# Patient Record
Sex: Female | Born: 1985 | Hispanic: No | Marital: Single | Smoking: Current every day smoker
Health system: Southern US, Community
[De-identification: ages and names within clinical notes are randomized; demographics above are authoritative.]

## PROBLEM LIST (undated history)

## (undated) DIAGNOSIS — D649 Anemia, unspecified: Secondary | ICD-10-CM

## (undated) DIAGNOSIS — F329 Major depressive disorder, single episode, unspecified: Secondary | ICD-10-CM

## (undated) DIAGNOSIS — J45909 Unspecified asthma, uncomplicated: Secondary | ICD-10-CM

## (undated) DIAGNOSIS — Z8719 Personal history of other diseases of the digestive system: Secondary | ICD-10-CM

## (undated) DIAGNOSIS — F419 Anxiety disorder, unspecified: Secondary | ICD-10-CM

## (undated) DIAGNOSIS — K8020 Calculus of gallbladder without cholecystitis without obstruction: Secondary | ICD-10-CM

## (undated) DIAGNOSIS — F32A Depression, unspecified: Secondary | ICD-10-CM

## (undated) HISTORY — DX: Depression, unspecified: F32.A

## (undated) HISTORY — DX: Major depressive disorder, single episode, unspecified: F32.9

## (undated) HISTORY — DX: Anxiety disorder, unspecified: F41.9

## (undated) HISTORY — DX: Unspecified asthma, uncomplicated: J45.909

---

## 2012-04-24 ENCOUNTER — Ambulatory Visit: Payer: Self-pay | Admitting: Family Medicine

## 2012-04-24 VITALS — BP 109/75 | HR 90 | Temp 98.5°F | Resp 18 | Ht 63.0 in | Wt 135.0 lb

## 2012-04-24 DIAGNOSIS — F411 Generalized anxiety disorder: Secondary | ICD-10-CM | POA: Insufficient documentation

## 2012-04-24 MED ORDER — CLONAZEPAM 0.5 MG PO TABS: ORAL_TABLET | ORAL | Status: DC

## 2012-04-24 NOTE — Patient Instructions (Addendum)
Please come and see [place] again in 1 or 2 weeks to check on your progress.  We need to have you see a counselor and will work on this the next time we see you.  If you are getting worse in the meantime please call or come back in.
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
## 2012-07-19 ENCOUNTER — Telehealth: Payer: Self-pay

## 2012-07-19 DIAGNOSIS — F411 Generalized anxiety disorder: Secondary | ICD-10-CM

## 2012-07-19 MED ORDER — CLONAZEPAM 0.5 MG PO TABS: ORAL_TABLET | ORAL | Status: DC
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
## 2012-07-19 NOTE — Telephone Encounter (Signed)
Patient is calling for a refill on Klonopin that was prescribed by Dr. Patsy Lager this year.  Call back at: [phone] for any questions. Patient has contacted pharmacy for a request already.   Pharmacy:  [id number] - [place], [place] - [address] AT [facility] [address]

## 2012-07-23 ENCOUNTER — Encounter (HOSPITAL_COMMUNITY): Payer: Self-pay | Admitting: Emergency Medicine

## 2012-07-23 ENCOUNTER — Emergency Department (HOSPITAL_COMMUNITY)
Admission: EM | Admit: 2012-07-23 | Discharge: 2012-07-24 | Disposition: A | Discharge status: Home / Self Care | Payer: Self-pay | Attending: Emergency Medicine | Admitting: Emergency Medicine

## 2012-07-23 DIAGNOSIS — W268XXA Contact with other sharp object(s), not elsewhere classified, initial encounter: Secondary | ICD-10-CM | POA: Insufficient documentation

## 2012-07-23 DIAGNOSIS — S61209A Unspecified open wound of unspecified finger without damage to nail, initial encounter: Secondary | ICD-10-CM | POA: Insufficient documentation

## 2012-07-23 DIAGNOSIS — Y9389 Activity, other specified: Secondary | ICD-10-CM | POA: Insufficient documentation

## 2012-07-23 DIAGNOSIS — Y92410 Unspecified street and highway as the place of occurrence of the external cause: Secondary | ICD-10-CM | POA: Insufficient documentation

## 2012-07-23 DIAGNOSIS — Z79899 Other long term (current) drug therapy: Secondary | ICD-10-CM | POA: Insufficient documentation

## 2012-07-23 DIAGNOSIS — F411 Generalized anxiety disorder: Secondary | ICD-10-CM | POA: Insufficient documentation

## 2012-07-23 DIAGNOSIS — S61219A Laceration without foreign body of unspecified finger without damage to nail, initial encounter: Secondary | ICD-10-CM

## 2012-07-23 DIAGNOSIS — F17200 Nicotine dependence, unspecified, uncomplicated: Secondary | ICD-10-CM | POA: Insufficient documentation

## 2012-07-23 NOTE — ED Notes (Signed)
PT. PRESENTS WITH RIGHT INDEX FINGER LACERATION APPROX. 1 1/2 INCH SUSTAINED WHILE MOVING  A GLASS TABLE THIS EVENING BLEEDING CONTROLLED , DRESSING APPLIED AT TRIAGE .

## 2012-07-24 ENCOUNTER — Emergency Department (HOSPITAL_COMMUNITY)
Admission: RE | Admit: 2012-07-24 | Discharge: 2012-07-24 | Disposition: A | Discharge status: Home / Self Care | Payer: Self-pay | Source: Ambulatory Visit | Attending: Emergency Medicine | Admitting: Emergency Medicine

## 2012-07-24 MED ORDER — TETANUS-DIPHTH-ACELL PERTUSSIS 5-2.5-18.5 LF-MCG/0.5 IM SUSP
0.5000 mL | Freq: Once | INTRAMUSCULAR | Status: DC
  Filled 2012-07-24: qty 0.5

## 2012-07-24 MED ORDER — OXYCODONE-ACETAMINOPHEN 5-325 MG PO TABS
2.0000 | ORAL_TABLET | Freq: Once | ORAL | Status: DC
  Filled 2012-07-24: qty 2

## 2012-07-24 NOTE — ED Provider Notes (Signed)
History     CSN: [id number]  Arrival date & time 07/23/12  2105   First MD Initiated Contact with Patient 07/23/12 2328      Chief Complaint  Patient presents with  . Finger Injury    (Consider location/radiation/quality/duration/timing/severity/associated sxs/prior treatment) HPI Comments: Patient is a 27 year old female with no significant past medical history of presents for a laceration to her right index finger. Patient states she was moving a glass table at home when it fell, broke in half, and sliced her finger. Patient minutes to a throbbing pain sensation runny aggravating or alleviating factors that is nonradiating. Patient denies pallor, numbness, and coolness of her right index finger. Date of last tetanus is unknown.  The history is provided by the patient. No language interpreter was used.    Past Medical History  Diagnosis Date  . Anxiety     History reviewed. No pertinent past surgical history.  No family history on file.  History  Substance Use Topics  . Smoking status: Light Tobacco Smoker  . Smokeless tobacco: Not on file  . Alcohol Use: No    OB History   Grav Para Term Preterm Abortions TAB SAB Ect Mult Living                  Review of Systems  Constitutional: Negative for fever.  Musculoskeletal: Negative for joint swelling.  Skin: Positive for wound. Negative for pallor.  Neurological: Negative for weakness and numbness.  All other systems reviewed and are negative.    Allergies  Review of patient's allergies indicates no known allergies.  Home Medications   Current Outpatient Rx  Name  Route  Sig  Dispense  Refill  . Acetaminophen-Caff-Pyrilamine (MIDOL COMPLETE PO)   Oral   Take 1 tablet by mouth 2 (two) times daily as needed (menstral cramps).         . citalopram (CELEXA) 20 MG tablet   Oral   Take 20 mg by mouth daily.         . clonazePAM (KLONOPIN) 0.5 MG tablet   Oral   Take 0.25-0.5 mg by mouth every 8 (eight)  hours as needed for anxiety.           BP 127/68  Pulse 90  Temp(Src) 98.1 F (36.7 C) (Oral)  Resp 16  SpO2 100%  LMP 07/14/2012  Physical Exam  Nursing note and vitals reviewed. Constitutional: She is oriented to person, place, and time. She appears well-developed and well-nourished. No distress.  HENT:  Head: Normocephalic.  Eyes: Conjunctivae and EOM are normal. No scleral icterus.  Neck: Normal range of motion.  Cardiovascular: Normal rate, regular rhythm and intact distal pulses.   Distal radial pulse 2+. Capillary refill normal and right upper extremity  Pulmonary/Chest: Effort normal. No respiratory distress.  Musculoskeletal:       Right hand: She exhibits tenderness and laceration. She exhibits normal range of motion, no bony tenderness and normal capillary refill. Normal sensation noted. Normal strength noted.       Hands: 3.5-4 cm laceration to the palmar aspect of the right index finger distal to the PIP joint. Patient exhibits full range of motion of her right hand and fingers. No swelling, pallor, or paresthesias noted. Capillary refill normal.  Neurological: She is alert and oriented to person, place, and time.  Skin: Skin is warm and dry. She is not diaphoretic. No pallor.  Psychiatric: She has a normal mood and affect. Her behavior is normal.  ED Course  Procedures (including critical care time)  Labs Reviewed - No data to display No results found.  LACERATION REPAIR Performed by: Antony Madura Authorized by: Antony Madura Consent: Verbal consent obtained. Risks and benefits: risks, benefits and alternatives were discussed Consent given by: patient Patient identity confirmed: provided demographic data Prepped and Draped in normal sterile fashion Wound explored  Laceration Location: R index finger on palmar surface  Laceration Length: 4cm  No Foreign Bodies seen or palpated  Anesthesia: local infiltration  Local anesthetic: lidocaine 2%  without epinephrine  Anesthetic total: 0.5 ml  Irrigation method: syringe Amount of cleaning: standard  Skin closure: 5-0 black monofilament nonabsorbable  Number of sutures: 5  Technique: simple interrupted  Patient tolerance: Patient tolerated the procedure well with no immediate complications.   1. Laceration of finger of right hand, initial encounter     MDM  Patient presents for a laceration to her right index finger. Patient is neurovascularly intact without erythema, swelling, warmth, or drainage to suspect infection. X-ray ordered to rule out fracture. Tetanus ordered to be given in ED.  Have called radiology reading room who state radiologist impression without any radiopaque foreign body, fracture, or dislocation. Patient's laceration closed with 5 simple sutures; patient tolerated well. Appropriate for d/c with follow up in 10-14 days for suture removal. Referral to hand specialist provided and indications for ED return discussed. Patient states comfort and understanding with this d/c plan with no unaddressed concerns.      Antony Madura, [place] 07/24/12 [phone]

## 2012-07-24 NOTE — ED Notes (Signed)
See downtime charting. 

## 2012-07-24 NOTE — ED Provider Notes (Signed)
Medical screening examination/treatment/procedure(s) were performed by non-physician practitioner and as supervising physician I was immediately available for consultation/collaboration.   Dreden Rivere L Raahi Korber, MD 07/24/12 0718 

## 2012-11-12 ENCOUNTER — Telehealth: Payer: Self-pay

## 2012-11-12 MED ORDER — CITALOPRAM HYDROBROMIDE 20 MG PO TABS: 20.0000 mg | ORAL_TABLET | Freq: Every day | ORAL | Status: DC

## 2012-11-12 NOTE — Telephone Encounter (Signed)
No refill request received.  Pt needs follow up.  Will send 1 month of celexa and will forward request for klonopin to Dr. Patsy Lager to advise.  Needs OV for further refills  Dr. Patsy Lager - looks like this pt is long overdue for follow up, but wanted you to be aware of the request

## 2012-11-12 NOTE — Telephone Encounter (Signed)
PT STATES SHE IS IN NEED OF HER CELEXA AND KLONOPIN, HAVE BEEN OUT FOR A WHILE NOW AND THE PHARMACY STATED THEY HAVE BEEN TRYING TO GET IN TOUCH WITH [place] PLEASE CALL [phone]    [facility] [address]

## 2012-11-13 MED ORDER — CLONAZEPAM 0.5 MG PO TABS: 0.2500 mg | ORAL_TABLET | Freq: Three times a day (TID) | ORAL | Status: DC | PRN

## 2012-11-13 NOTE — Telephone Encounter (Signed)
Advised patient she is due for follow up

## 2012-11-13 NOTE — Addendum Note (Signed)
Addended by: Abbe Amsterdam C on: 11/13/2012 01:34 PM   Modules accepted: Orders

## 2012-12-11 ENCOUNTER — Other Ambulatory Visit: Payer: Self-pay | Admitting: Physician Assistant

## 2013-01-17 ENCOUNTER — Other Ambulatory Visit: Payer: Self-pay | Admitting: Physician Assistant

## 2013-01-17 ENCOUNTER — Ambulatory Visit: Payer: Self-pay | Admitting: Family Medicine

## 2013-01-17 VITALS — BP 105/80 | HR 80 | Temp 97.8°F | Resp 18 | Ht 62.5 in | Wt 176.0 lb

## 2013-01-17 DIAGNOSIS — F411 Generalized anxiety disorder: Secondary | ICD-10-CM

## 2013-01-17 MED ORDER — CLONAZEPAM 0.5 MG PO TABS: 0.2500 mg | ORAL_TABLET | Freq: Three times a day (TID) | ORAL | Status: DC | PRN

## 2013-01-17 MED ORDER — CITALOPRAM HYDROBROMIDE 20 MG PO TABS: 20.0000 mg | ORAL_TABLET | Freq: Every day | ORAL | Status: DC

## 2013-01-17 NOTE — Telephone Encounter (Signed)
PT STATES THAT SHE HAS NOT HAD THIS MEDICATION IN 6 DAYS AND SHE IS BEGINNING TO EXPERIENCE WITHDRAWALS. BEST# [phone]

## 2013-01-17 NOTE — Patient Instructions (Signed)
Go back to taking your celexa as usually.  Please come and see me in 6 months or sooner if needed.   Use the klonopin only as needed for anxiety

## 2013-01-17 NOTE — Progress Notes (Signed)
[facility] [address], [place] [address] [phone]  Date:  01/17/2013   Name:  Norma Goodman   DOB:  [birth year]   MRN:  [id number]  PCP:  No PCP Per Patient    Chief Complaint: Medication Refill   History of Present Illness:  Norma Goodman is a 27 y.o. very pleasant female patient who presents with the following:  She is here today due to needing a refill on her celexa.  I last saw her in February for our first visit and refilled her celexa.  She did run out of her clelexa about 5 days ago.  She has noted some nausea and vomiting, having bad dreams since she ran out.  She has been taking a klonopin daily since she ran out of her celexa- prior to this she was taking a 1/2 tab every 2 or 3 weeks.    She does feel that when she is taking the celexa she is doing ok.  Her daugher will be turning 2 in January. She has special needs so Ashle stays home taking care of her.  She is living with her mother right now.  Overall she feels that her mood is ok, she does not have any SI.  Wonders if she might be able to stop the celexa at some point  At this time she does not want to see a Veterinary surgeon.    LMP about 3 weeks ago  Patient Active Problem List   Diagnosis Date Noted  . Anxiety state, unspecified 04/24/2012    Past Medical History  Diagnosis Date  . Anxiety     History reviewed. No pertinent past surgical history.  History  Substance Use Topics  . Smoking status: Light Tobacco Smoker  . Smokeless tobacco: Not on file  . Alcohol Use: No    Family History  Problem Relation Age of Onset  . Diabetes Father   . Hypertension Father   . Heart disease Father     No Known Allergies  Medication list has been reviewed and updated.  Current Outpatient Prescriptions on File Prior to Visit  Medication Sig Dispense Refill  . citalopram (CELEXA) 20 MG tablet Take 1 tablet (20 mg total) by mouth daily. Needs office visit for further refills-2nd  notice  30 tablet  0  . clonazePAM (KLONOPIN) 0.5 MG tablet Take 0.5-1 tablets (0.25-0.5 mg total) by mouth every 8 (eight) hours as needed for anxiety.  15 tablet  0   No current facility-administered medications on file prior to visit.    Review of Systems:  As per HPI- otherwise negative.   Physical Examination: Filed Vitals:   01/17/13 1146  BP: 90/50  Pulse: 80  Temp: 97.8 F (36.6 C)  Resp: 18   Filed Vitals:   01/17/13 1146  Height: 5' 2.5" (1.588 m)  Weight: 176 lb (79.833 kg)   Body mass index is 31.66 kg/(m^2). Ideal Body Weight: Weight in (lb) to have BMI = 25: 138.6  GEN: WDWN, NAD, Non-toxic, A & O x 3, overweight HEENT: Atraumatic, Normocephalic. Neck supple. No masses, No LAD. Ears and Nose: No external deformity. CV: RRR, No M/G/R. No JVD. No thrill. No extra heart sounds. PULM: CTA B, no wheezes, crackles, rhonchi. No retractions. No resp. distress. No accessory muscle use. EXTR: No c/c/e NEURO Normal gait.  PSYCH: Normally interactive. Conversant. Not depressed or anxious appearing.  Calm demeanor.    Assessment and Plan: Generalized anxiety disorder - Plan: citalopram (CELEXA)  20 MG tablet, clonazePAM (KLONOPIN) 0.5 MG tablet, DISCONTINUED: clonazePAM (KLONOPIN) 0.5 MG tablet  Refilled her celexa.  If her sx do not resolve once back on her medication she will let me know.  Encouraged her to try and continue using her klonopin only when 100% necessary.  Suggested that we wait until spring to try and d/c her celexa if she would like to avoid SAD symptoms.   Follow- up in 6 months   Signed Abbe Amsterdam, MD

## 2013-03-03 ENCOUNTER — Other Ambulatory Visit: Payer: Self-pay | Admitting: Family Medicine

## 2013-03-03 DIAGNOSIS — F411 Generalized anxiety disorder: Secondary | ICD-10-CM

## 2013-04-10 ENCOUNTER — Other Ambulatory Visit: Payer: Self-pay | Admitting: Family Medicine

## 2013-04-10 DIAGNOSIS — F411 Generalized anxiety disorder: Secondary | ICD-10-CM

## 2013-05-02 ENCOUNTER — Telehealth: Payer: Self-pay | Admitting: General Practice

## 2013-05-02 DIAGNOSIS — F411 Generalized anxiety disorder: Secondary | ICD-10-CM

## 2013-05-02 NOTE — Telephone Encounter (Signed)
Patient called requesting a refill on clonazePAM [facility]) Please advise

## 2013-05-04 MED ORDER — CLONAZEPAM 0.5 MG PO TABS: ORAL_TABLET | ORAL | Status: DC

## 2013-05-29 ENCOUNTER — Other Ambulatory Visit: Payer: Self-pay | Admitting: Family Medicine

## 2013-06-07 ENCOUNTER — Telehealth: Payer: Self-pay

## 2013-06-07 DIAGNOSIS — F411 Generalized anxiety disorder: Secondary | ICD-10-CM

## 2013-06-07 NOTE — Telephone Encounter (Signed)
PATIENT STATES SHE CALLED HER PHARMACY TO REQUEST A REFILL ON CLONAZEPAM 0.5 MG, BUT THEY TOLD HER TO CALL HER DOCTOR'S OFFICE. PLEASE CALL PATIENT WHEN IT HAS BEEN CALLED IN. BEST PHONE [phone] (CELL)  PHARMACY CHOICE IS [place]

## 2013-06-08 MED ORDER — CLONAZEPAM 0.5 MG PO TABS: ORAL_TABLET | ORAL | Status: DC

## 2013-06-13 ENCOUNTER — Other Ambulatory Visit: Payer: Self-pay | Admitting: Family Medicine

## 2013-06-13 DIAGNOSIS — F411 Generalized anxiety disorder: Secondary | ICD-10-CM

## 2013-06-13 NOTE — Telephone Encounter (Signed)
Patients spouse called to see if Celexa refill can be sped up since they are leaving out of town tonight. Please call when refill is called in. They had pharamacy already send in request.   Best: [phone]

## 2013-06-14 MED ORDER — CITALOPRAM HYDROBROMIDE 20 MG PO TABS: 20.0000 mg | ORAL_TABLET | Freq: Every day | ORAL | Status: DC

## 2013-07-01 ENCOUNTER — Other Ambulatory Visit: Payer: Self-pay | Admitting: Family Medicine

## 2013-07-01 DIAGNOSIS — F411 Generalized anxiety disorder: Secondary | ICD-10-CM

## 2013-07-01 NOTE — Telephone Encounter (Signed)
Called her; I have been asking her to come in for a recheck. Had to [facility]. I will send in 5 more klonopin but there will not be any more until she has an OV here.  Please come and see me asap

## 2013-07-14 ENCOUNTER — Other Ambulatory Visit: Payer: Self-pay | Admitting: Family Medicine

## 2013-07-15 NOTE — Telephone Encounter (Signed)
Called and discussed with her.  She knows she cannot have more without an OV.  Gave her some options to come and see me and she plans to do so

## 2013-08-22 ENCOUNTER — Telehealth: Payer: Self-pay

## 2013-08-22 NOTE — Telephone Encounter (Signed)
Called her back.  She now has medicaid which we do not accept.  We will work with her on cost, however I cannot refill this another time without a visit.  She understands and will come in tomorrow

## 2013-08-22 NOTE — Telephone Encounter (Signed)
Dr Lorelei Pont  Patient has an insurance we do not accept.   She cannot afford to come in to be seen. She needs her anxiety medication.   [phone]

## 2013-08-23 ENCOUNTER — Ambulatory Visit (INDEPENDENT_AMBULATORY_CARE_PROVIDER_SITE_OTHER): Payer: Self-pay | Admitting: Family Medicine

## 2013-08-23 VITALS — BP 130/92 | HR 96 | Temp 98.6°F | Resp 18 | Ht 62.5 in | Wt 196.8 lb

## 2013-08-23 DIAGNOSIS — F411 Generalized anxiety disorder: Secondary | ICD-10-CM

## 2013-08-23 DIAGNOSIS — R635 Abnormal weight gain: Secondary | ICD-10-CM

## 2013-08-23 LAB — POCT URINE PREGNANCY: PREG TEST UR: NEGATIVE

## 2013-08-23 MED ORDER — CLONAZEPAM 0.5 MG PO TABS: ORAL_TABLET | ORAL | Status: DC

## 2013-08-23 MED ORDER — CITALOPRAM HYDROBROMIDE 40 MG PO TABS: 40.0000 mg | ORAL_TABLET | Freq: Every day | ORAL | Status: DC

## 2013-08-23 NOTE — Progress Notes (Signed)
[facility] [address] [place] [address] [phone]  Date:  08/23/2013   Name:  Norma Goodman   DOB:  Dec 06, 1985   MRN:  [id number]  PCP:  No PCP Per Patient    Chief Complaint: Medication Refill and Weight Gain   History of Present Illness:  Norma Goodman is a 28 y.o. very pleasant female patient who presents with the following:  Here today for a medication refill.  Last seen by myself about 6 months ago- she has not wanted to follow-up for financial reasons and actually does now have medicaid. She has not yet found out who is her medicaid provider. However I told her that I cannot refill her medications another time unless she is seen, so she did come in today.   She notes that she still does have some anxiety, and has gained some weight recently.  Her sleep is good.  Her appetite is good.  She does note some depression, and "I get stressed out really easily."  She will feel nervous at little provocation, "I have too much drama."  She is also concerned about weight gain- Wt Readings from Last 3 Encounters:  08/23/13 196 lb 12.8 oz (89.268 kg)  01/17/13 176 lb (79.833 kg)  04/24/12 135 lb (61.236 kg)   She does note that she added meat back to her diet over the last year or so, and that she has "gotten her appetite back."  She notes some bloating and gas, and can have diarrhea.  She did have an episiotomy with her delivery, and notes that she can sometimes have some fecal accidents since delivery.  Her daughter is now 27.26 years old Admits to drinking at least 2 regular sodas a day and that she does not really exercise.   She is SA with her SO- they do not use any contraception.    Patient Active Problem List   Diagnosis Date Noted  . Anxiety state, unspecified 04/24/2012    Past Medical History  Diagnosis Date  . Anxiety   . Depression     History reviewed. No pertinent past surgical history.  History  Substance Use Topics  . Smoking  status: Light Tobacco Smoker  . Smokeless tobacco: Not on file  . Alcohol Use: No    Family History  Problem Relation Age of Onset  . Diabetes Father   . Hypertension Father   . Heart disease Father     No Known Allergies  Medication list has been reviewed and updated.  Current Outpatient Prescriptions on File Prior to Visit  Medication Sig Dispense Refill  . aspirin-acetaminophen-caffeine (EXCEDRIN MIGRAINE) 250-250-65 MG per tablet Take by mouth every 6 (six) hours as needed for headache.      . citalopram (CELEXA) 20 MG tablet Take 1 tablet (20 mg total) by mouth daily.  90 tablet  0  . clonazePAM (KLONOPIN) 0.5 MG tablet Take 1/2 or 1 tablet every 8 hours as needed for anxiety.  There will NOT BE ANY MORE REFILLS until office visit is complete.  5 tablet  0   No current facility-administered medications on file prior to visit.    Review of Systems:  As per HPI- otherwise negative.   Physical Examination: Filed Vitals:   08/23/13 1558  BP: 130/92  Pulse: 117  Temp: 98.6 F (37 C)  Resp: 18   Filed Vitals:   08/23/13 1558  Height: 5' 2.5" (1.588 m)  Weight: 196 lb 12.8 oz (89.268 kg)  Body mass index is 35.4 kg/(m^2). Ideal Body Weight: Weight in (lb) to have BMI = 25: 138.6  GEN: WDWN, NAD, Non-toxic, A & O x 3, obese, looks well HEENT: Atraumatic, Normocephalic. Neck supple. No masses, No LAD. Ears and Nose: No external deformity. CV: RRR, No M/G/R. No JVD. No thrill. No extra heart sounds. PULM: CTA B, no wheezes, crackles, rhonchi. No retractions. No resp. distress. No accessory muscle use. ABD: S, NT, ND, +BS. No rebound. No HSM. EXTR: No c/c/e NEURO Normal gait.  PSYCH: Normally interactive. Conversant. Not depressed or anxious appearing.  Calm demeanor.   Results for orders placed in visit on 08/23/13  POCT URINE PREGNANCY      Result Value Ref Range   Preg Test, Ur Negative      Assessment and Plan: Weight gain - Plan: POCT urine  pregnancy  Generalized anxiety disorder - Plan: citalopram (CELEXA) 40 MG tablet  GAD (generalized anxiety disorder) - Plan: clonazePAM (KLONOPIN) 0.5 MG tablet  Will increase her citalopram to 40 mg as she continues to notice sx of depression and anxiety.  She denies any SI.  She does like to have some klonopin on hand to use prn but understands that this medication is habit forming and that she needs to minimize her use.  Recommended a TSH level and possible GI referral, but she will plan to address these issues through her medicaid provider.   Discussed weight management strategies that she will work on See patient instructions for more details.   Encouraged condoms as they do not desire another pregnancy right now.  She may be able to get implanon or an IUD as well through her medicaid    Signed Lamar Blinks, MD

## 2013-08-23 NOTE — Patient Instructions (Signed)
We are going to try going up on your celexa to 40 mg- I hope that this will help with your symptoms of anxiety and depression.   Continue to use the klonopin as needed, but remember that this is habit forming.    Work on quitting sodas, and on cutting down on your portion size.  Walking 30- 45 minutes a day is also a good idea; I suspect that if you follow these steps you will be able to lose 1-2 lbs per week and will get to your desired weight gradually.

## 2013-09-05 ENCOUNTER — Other Ambulatory Visit: Payer: Self-pay | Admitting: Family Medicine

## 2013-09-05 DIAGNOSIS — F411 Generalized anxiety disorder: Secondary | ICD-10-CM

## 2013-09-06 NOTE — Telephone Encounter (Signed)
Called and LMOM.  It seems she is going through her klonopin faster than usual- is all ok?  Also please establish with her medicaid provider who will be taking care of her in the future

## 2013-09-26 ENCOUNTER — Other Ambulatory Visit: Payer: Self-pay | Admitting: Family Medicine

## 2013-09-26 DIAGNOSIS — F411 Generalized anxiety disorder: Secondary | ICD-10-CM

## 2013-09-28 NOTE — Telephone Encounter (Signed)
Refilled for her today.  She is supposed to be getting in with her medicaid provider.  If she asks again will remind her of this.

## 2013-10-10 ENCOUNTER — Other Ambulatory Visit: Payer: Self-pay | Admitting: Family Medicine

## 2013-10-10 DIAGNOSIS — F411 Generalized anxiety disorder: Secondary | ICD-10-CM

## 2013-10-10 NOTE — Telephone Encounter (Signed)
Called and LMOM. I received her klonopin request again.  As we have discussed, she needs to establish with her medicaid provide.  I will give her 10 more- that will be all from my office  Meds ordered this encounter  Medications  . clonazePAM (KLONOPIN) 0.5 MG tablet    Sig: Take 1/2 or 1 tablet every 8 hours as needed for anxiety.  This will be your last rx from my office.  Please contact your medicaid provider    Dispense:  10 tablet    Refill:  0

## 2014-03-13 HISTORY — PX: ESOPHAGOGASTRODUODENOSCOPY: SHX1529

## 2014-09-07 ENCOUNTER — Ambulatory Visit (INDEPENDENT_AMBULATORY_CARE_PROVIDER_SITE_OTHER): Payer: Self-pay | Admitting: Emergency Medicine

## 2014-09-07 VITALS — BP 108/88 | HR 113 | Temp 98.6°F | Resp 20 | Ht 63.0 in | Wt 194.0 lb

## 2014-09-07 DIAGNOSIS — K921 Melena: Secondary | ICD-10-CM

## 2014-09-07 LAB — POCT CBC
Granulocyte percent: 84.9 %G — AB (ref 37–80)
HCT, POC: 40.1 % (ref 37.7–47.9)
Hemoglobin: 13.1 g/dL (ref 12.2–16.2)
Lymph, poc: 2.4 (ref 0.6–3.4)
MCH, POC: 28.3 pg (ref 27–31.2)
MCHC: 32.6 g/dL (ref 31.8–35.4)
MCV: 86.9 fL (ref 80–97)
MID (cbc): 0.4 (ref 0–0.9)
MPV: 7.6 fL (ref 0–99.8)
POC Granulocyte: 15.6 — AB (ref 2–6.9)
POC LYMPH PERCENT: 13.1 %L (ref 10–50)
POC MID %: 2 %M (ref 0–12)
Platelet Count, POC: 478 10*3/uL — AB (ref 142–424)
RBC: 4.62 M/uL (ref 4.04–5.48)
RDW, POC: 14.8 %
WBC: 18.4 10*3/uL — AB (ref 4.6–10.2)

## 2014-09-07 NOTE — Patient Instructions (Signed)
Rectal Bleeding °Rectal bleeding is when blood passes out of the anus. It is usually a sign that something is wrong. It may not be serious, but it should always be evaluated. Rectal bleeding may present as bright red blood or extremely dark stools. The color may range from dark red or maroon to black (like tar). It is important that the cause of rectal bleeding be identified so treatment can be started and the problem corrected. °CAUSES  °· Hemorrhoids. These are enlarged (dilated) blood vessels or veins in the anal or rectal area. °· Fistulas. These are abnormal, burrowing channels that usually run from inside the rectum to the skin around the anus. They can bleed. °· Anal fissures. This is a tear in the tissue of the anus. Bleeding occurs with bowel movements. °· Diverticulosis. This is a condition in which pockets or sacs project from the bowel wall. Occasionally, the sacs can bleed. °· Diverticulitis. This is an infection involving diverticulosis of the colon. °· Proctitis and colitis. These are conditions in which the rectum, colon, or both, can become inflamed and pitted (ulcerated). °· Polyps and cancer. Polyps are non-cancerous (benign) growths in the colon that may bleed. Certain types of polyps turn into cancer. °· Protrusion of the rectum. Part of the rectum can project from the anus and bleed. °· Certain medicines. °· Intestinal infections. °· Blood vessel abnormalities. °HOME CARE INSTRUCTIONS °· Eat a high-fiber diet to keep your stool soft. °· Limit activity. °· Drink enough fluids to keep your urine clear or pale yellow. °· Warm baths may be useful to soothe rectal pain. °· Follow up with your caregiver as directed. °SEEK IMMEDIATE MEDICAL CARE IF: °· You develop increased bleeding. °· You have black or dark red stools. °· You vomit blood or material that looks like coffee grounds. °· You have abdominal pain or tenderness. °· You have a fever. °· You feel weak, nauseous, or you faint. °· You have  severe rectal pain or you are unable to have a bowel movement. °MAKE SURE YOU: °· Understand these instructions. °· Will watch your condition. °· Will get help right away if you are not doing well or get worse. °Document Released: 08/19/2001 Document Revised: 05/22/2011 Document Reviewed: 08/14/2010 °ExitCare® Patient Information ©2015 [employer]. This information is not intended to replace advice given to you by your health care provider. Make sure you discuss any questions you have with your health care provider. ° °

## 2014-09-07 NOTE — Progress Notes (Signed)
Subjective:  Patient ID: Norma Goodman, female    DOB: Jun 09, 1985  Age: 29 y.o. MRN: [id number]  CC: Rectal Bleeding   HPI Norma Goodman presents  with a history of one years duration rectal bleeding. She said she has painful stools. Noticed any change in stool caliber. No nausea or vomiting. She has no abdominal pain. No fever or chills. She has no history of gastrointestinal disorders. She had no improvement with over-the-counter medication.  History Norma Goodman has a past medical history of Anxiety and Depression.   She has no past surgical history on file.   Her  family history includes Diabetes in her father; Heart disease in her father; Hypertension in her father.  She   reports that she has been smoking.  She does not have any smokeless tobacco history on file. She reports that she does not drink alcohol or use illicit drugs.  Outpatient Prescriptions Prior to Visit  Medication Sig Dispense Refill  . aspirin-acetaminophen-caffeine (EXCEDRIN MIGRAINE) 250-250-65 MG per tablet Take by mouth every 6 (six) hours as needed for headache.    . citalopram (CELEXA) 40 MG tablet Take 1 tablet (40 mg total) by mouth daily. 90 tablet 1  . clonazePAM (KLONOPIN) 0.5 MG tablet Take 1/2 or 1 tablet every 8 hours as needed for anxiety.  This will be your last rx from my office.  Please contact your medicaid provider 10 tablet 0   No facility-administered medications prior to visit.    History   Social History  . Marital Status: Single    Spouse Name: N/A  . Number of Children: N/A  . Years of Education: N/A   Social History Main Topics  . Smoking status: Light Tobacco Smoker  . Smokeless tobacco: Not on file  . Alcohol Use: No  . Drug Use: No  . Sexual Activity: Not on file   Other Topics Concern  . None   Social History Narrative     Review of Systems  Constitutional: Negative for fever, chills and appetite change.  HENT: Negative for congestion, ear pain, postnasal  drip, sinus pressure and sore throat.   Eyes: Negative for pain and redness.  Respiratory: Negative for cough, shortness of breath and wheezing.   Cardiovascular: Negative for leg swelling.  Gastrointestinal: Positive for rectal pain. Negative for nausea, vomiting, abdominal pain, diarrhea, constipation and blood in stool.  Endocrine: Negative for polyuria.  Genitourinary: Negative for dysuria, urgency, frequency and flank pain.  Musculoskeletal: Negative for gait problem.  Skin: Negative for rash.  Neurological: Negative for weakness and headaches.  Psychiatric/Behavioral: Negative for confusion and decreased concentration. The patient is not nervous/anxious.     Objective:  BP 108/88 mmHg  Pulse 113  Temp(Src) 98.6 F (37 C)  Resp 20  Ht 5\' 3"  (1.6 m)  Wt 194 lb (87.998 kg)  BMI 34.37 kg/m2  SpO2 94%  LMP 08/17/2014  Physical Exam  Constitutional: She is oriented to person, place, and time. She appears well-developed and well-nourished. No distress.  HENT:  Head: Normocephalic and atraumatic.  Right Ear: External ear normal.  Left Ear: External ear normal.  Nose: Nose normal.  Eyes: Conjunctivae and EOM are normal. Pupils are equal, round, and reactive to light. No scleral icterus.  Neck: Normal range of motion. Neck supple. No tracheal deviation present.  Cardiovascular: Normal rate, regular rhythm and normal heart sounds.   Pulmonary/Chest: Effort normal. No respiratory distress. She has no wheezes. She has no rales.  Abdominal: She exhibits no mass.  There is no tenderness. There is no rebound and no guarding.  Musculoskeletal: She exhibits no edema.  Lymphadenopathy:    She has no cervical adenopathy.  Neurological: She is alert and oriented to person, place, and time. Coordination normal.  Skin: Skin is warm and dry. No rash noted.  Psychiatric: She has a normal mood and affect. Her behavior is normal.   Digital rectal examination was significant for a nodular  tenderness on the anterior anal wall. There is no visible gross blood  Assessment & Plan:   Norma Goodman was seen today for rectal bleeding.  Diagnoses and all orders for this visit:  Blood in stool Orders: -     POCT CBC -     Ambulatory referral to Gastroenterology   I am having Norma Goodman maintain her aspirin-acetaminophen-caffeine, citalopram, and clonazePAM.  No orders of the defined types were placed in this encounter.   Patient needs to follow-up with GI and she was sent on an urgent referral.  Appropriate red flag conditions were discussed with the patient as well as actions that should be taken.  Patient expressed his understanding.  Follow-up: No Follow-up on file.  Roselee Culver, MD   Results for orders placed or performed in visit on 09/07/14  POCT CBC  Result Value Ref Range   WBC 18.4 (A) 4.6 - 10.2 K/uL   Lymph, poc 2.4 0.6 - 3.4   POC LYMPH PERCENT 13.1 10 - 50 %L   MID (cbc) 0.4 0 - 0.9   POC MID % 2.0 0 - 12 %M   POC Granulocyte 15.6 (A) 2 - 6.9   Granulocyte percent 84.9 (A) 37 - 80 %G   RBC 4.62 4.04 - 5.48 M/uL   Hemoglobin 13.1 12.2 - 16.2 g/dL   HCT, POC 40.1 37.7 - 47.9 %   MCV 86.9 80 - 97 fL   MCH, POC 28.3 27 - 31.2 pg   MCHC 32.6 31.8 - 35.4 g/dL   RDW, POC 14.8 %   Platelet Count, POC 478 (A) 142 - 424 K/uL   MPV 7.6 0 - 99.8 fL

## 2014-09-09 ENCOUNTER — Encounter (HOSPITAL_COMMUNITY): Payer: Self-pay | Admitting: Emergency Medicine

## 2014-09-09 ENCOUNTER — Emergency Department (HOSPITAL_COMMUNITY): Payer: Medicaid Other

## 2014-09-09 ENCOUNTER — Emergency Department (HOSPITAL_COMMUNITY)
Admission: EM | Admit: 2014-09-09 | Discharge: 2014-09-09 | Disposition: A | Discharge status: Home / Self Care | Payer: Medicaid Other | Attending: Emergency Medicine | Admitting: Emergency Medicine

## 2014-09-09 DIAGNOSIS — Z720 Tobacco use: Secondary | ICD-10-CM | POA: Insufficient documentation

## 2014-09-09 DIAGNOSIS — F419 Anxiety disorder, unspecified: Secondary | ICD-10-CM | POA: Diagnosis not present

## 2014-09-09 DIAGNOSIS — R079 Chest pain, unspecified: Secondary | ICD-10-CM | POA: Diagnosis present

## 2014-09-09 DIAGNOSIS — G43909 Migraine, unspecified, not intractable, without status migrainosus: Secondary | ICD-10-CM | POA: Insufficient documentation

## 2014-09-09 DIAGNOSIS — R1013 Epigastric pain: Secondary | ICD-10-CM | POA: Insufficient documentation

## 2014-09-09 DIAGNOSIS — R0602 Shortness of breath: Secondary | ICD-10-CM | POA: Insufficient documentation

## 2014-09-09 DIAGNOSIS — F329 Major depressive disorder, single episode, unspecified: Secondary | ICD-10-CM | POA: Insufficient documentation

## 2014-09-09 DIAGNOSIS — Z79899 Other long term (current) drug therapy: Secondary | ICD-10-CM | POA: Insufficient documentation

## 2014-09-09 DIAGNOSIS — Z3202 Encounter for pregnancy test, result negative: Secondary | ICD-10-CM | POA: Diagnosis not present

## 2014-09-09 DIAGNOSIS — M546 Pain in thoracic spine: Secondary | ICD-10-CM | POA: Diagnosis not present

## 2014-09-09 DIAGNOSIS — R202 Paresthesia of skin: Secondary | ICD-10-CM | POA: Insufficient documentation

## 2014-09-09 LAB — BASIC METABOLIC PANEL
ANION GAP: 11 (ref 5–15)
BUN: 20 mg/dL (ref 6–20)
CALCIUM: 9.4 mg/dL (ref 8.9–10.3)
CO2: 23 mmol/L (ref 22–32)
CREATININE: 0.78 mg/dL (ref 0.44–1.00)
Chloride: 101 mmol/L (ref 101–111)
GFR calc Af Amer: >60 mL/min (ref >60)
GFR calc non Af Amer: >60 mL/min (ref >60)
Glucose, Bld: 80 mg/dL (ref 65–99)
Potassium: 3.9 mmol/L (ref 3.5–5.1)
Sodium: 135 mmol/L (ref 135–145)

## 2014-09-09 LAB — I-STAT TROPONIN, ED: Troponin i, poc: 0 ng/mL (ref 0.00–0.08)

## 2014-09-09 LAB — CBC
HEMATOCRIT: 40.5 % (ref 36.0–46.0)
HEMOGLOBIN: 13.6 g/dL (ref 12.0–15.0)
MCH: 29.6 pg (ref 26.0–34.0)
MCHC: 33.6 g/dL (ref 30.0–36.0)
MCV: 88.2 fL (ref 78.0–100.0)
PLATELETS: 497 10*3/uL — AB (ref 150–400)
RBC: 4.59 MIL/uL (ref 3.87–5.11)
RDW: 14.1 % (ref 11.5–15.5)
WBC: 17.8 10*3/uL — AB (ref 4.0–10.5)

## 2014-09-09 LAB — BRAIN NATRIURETIC PEPTIDE: B Natriuretic Peptide: 342.8 pg/mL — ABNORMAL HIGH (ref 0.0–100.0)

## 2014-09-09 LAB — POC URINE PREG, ED: Preg Test, Ur: NEGATIVE

## 2014-09-09 MED ORDER — OMEPRAZOLE 20 MG PO CPDR: 20.0000 mg | DELAYED_RELEASE_CAPSULE | Freq: Every day | ORAL | Status: DC

## 2014-09-09 MED ORDER — GI COCKTAIL ~~LOC~~
30.0000 mL | Freq: Once | ORAL | Status: AC
  Administered 2014-09-09: 30 mL via ORAL
  Filled 2014-09-09: qty 30

## 2014-09-09 MED ORDER — PANTOPRAZOLE SODIUM 40 MG PO TBEC: 40.0000 mg | DELAYED_RELEASE_TABLET | Freq: Every day | ORAL | Status: DC

## 2014-09-09 MED ORDER — HYDROCODONE-ACETAMINOPHEN 5-325 MG PO TABS
1.0000 | ORAL_TABLET | Freq: Once | ORAL | Status: AC
  Administered 2014-09-09: 1 via ORAL
  Filled 2014-09-09: qty 1

## 2014-09-09 NOTE — Discharge Instructions (Signed)
You were seen today for chest pain and back pain. You've recently also noted some blood in your stools. Your history and physical exam is most consistent with gastritis versus peptic ulcer disease. You will be given a acid reducer. He should follow-up with the GI doctor as directed by her primary physician.  Peptic Ulcer A peptic ulcer is a sore in the lining of your esophagus (esophageal ulcer), stomach (gastric ulcer), or in the first part of your small intestine (duodenal ulcer). The ulcer causes erosion into the deeper tissue. CAUSES  Normally, the lining of the stomach and the small intestine protects itself from the acid that digests food. The protective lining can be damaged by:  An infection caused by a bacterium called Helicobacter pylori (H. pylori).  Regular use of nonsteroidal anti-inflammatory drugs (NSAIDs), such as ibuprofen or aspirin.  Smoking tobacco. Other risk factors include being older than 68, drinking alcohol excessively, and having a family history of ulcer disease.  SYMPTOMS   Burning pain or gnawing in the area between the chest and the belly button.  Heartburn.  Nausea and vomiting.  Bloating. The pain can be worse on an empty stomach and at night. If the ulcer results in bleeding, it can cause:  Black, tarry stools.  Vomiting of bright red blood.  Vomiting of coffee-ground-looking materials. DIAGNOSIS  A diagnosis is usually made based upon your history and an exam. Other tests and procedures may be performed to find the cause of the ulcer. Finding a cause will help determine the best treatment. Tests and procedures may include:  Blood tests, stool tests, or breath tests to check for the bacterium H. pylori.  An upper gastrointestinal (GI) series of the esophagus, stomach, and small intestine.  An endoscopy to examine the esophagus, stomach, and small intestine.  A biopsy. TREATMENT  Treatment may include:  Eliminating the cause of the ulcer,  such as smoking, NSAIDs, or alcohol.  Medicines to reduce the amount of acid in your digestive tract.  Antibiotic medicines if the ulcer is caused by the H. pylori bacterium.  An upper endoscopy to treat a bleeding ulcer.  Surgery if the bleeding is severe or if the ulcer created a hole somewhere in the digestive system. HOME CARE INSTRUCTIONS   Avoid tobacco, alcohol, and caffeine. Smoking can increase the acid in the stomach, and continued smoking will impair the healing of ulcers.  Avoid foods and drinks that seem to cause discomfort or aggravate your ulcer.  Only take medicines as directed by your caregiver. Do not substitute over-the-counter medicines for prescription medicines without talking to your caregiver.  Keep any follow-up appointments and tests as directed. SEEK MEDICAL CARE IF:   Your do not improve within 7 days of starting treatment.  You have ongoing indigestion or heartburn. SEEK IMMEDIATE MEDICAL CARE IF:   You have sudden, sharp, or persistent abdominal pain.  You have bloody or dark black, tarry stools.  You vomit blood or vomit that looks like coffee grounds.  You become light-headed, weak, or feel faint.  You become sweaty or clammy. MAKE SURE YOU:   Understand these instructions.  Will watch your condition.  Will get help right away if you are not doing well or get worse. Document Released: 02/25/2000 Document Revised: 07/14/2013 Document Reviewed: 09/27/2011 [facility] Patient Information 2015 [place], [place]. This information is not intended to replace advice given to you by your health care provider. Make sure you discuss any questions you have with your health care provider.

## 2014-09-09 NOTE — ED Provider Notes (Signed)
CSN: [id number]     Arrival date & time 09/09/14  0055 History  This chart was scribed for Merryl Hacker, MD by Rayfield Citizen, ED Scribe. This patient was seen in room D30C/D30C and the patient's care was started at 2:39 AM.    Chief Complaint  Patient presents with  . Chest Pain   Patient is a 29 y.o. female presenting with chest pain. The history is provided by the patient. No language interpreter was used.  Chest Pain Associated symptoms: no abdominal pain, no back pain, no cough, no fever, no nausea, no shortness of breath and not vomiting      HPI Comments: Norma Goodman is a 29 y.o. female who presents to the Emergency Department complaining of sudden onset central chest pain, described as "pressure", with upper back pain and paresthesias to the left arm beginning this morning. Norma Goodman also reports mild SOB and vomiting today (no blood). No modifying factors noted at this time. Norma Goodman denies fever, cough. Norma Goodman denies prior experience with similar symptoms. Norma Goodman denies history of blood clots, lower extremity swelling. Norma Goodman denies exogenous estrogen use, recent travel or hospitalizations.   Patient's family reports that patient has been in and out of doctor's offices over the past several days for bright red "blood in her stool." Patient visited her PCP today for a referral to a specialist regarding this matter.   Patient takes Celexa, Klonopin daily; Norma Goodman takes Excedrin migraine regularly. Norma Goodman is a smoker.   Past Medical History  Diagnosis Date  . Anxiety   . Depression    History reviewed. No pertinent past surgical history. Family History  Problem Relation Age of Onset  . Diabetes Father   . Hypertension Father   . Heart disease Father    History  Substance Use Topics  . Smoking status: Light Tobacco Smoker  . Smokeless tobacco: Not on file  . Alcohol Use: No   OB History    No data available     Review of Systems  Constitutional: Negative for fever.  Respiratory: Negative  for cough, chest tightness and shortness of breath.   Cardiovascular: Positive for chest pain.  Gastrointestinal: Positive for blood in stool. Negative for nausea, vomiting and abdominal pain.  Genitourinary: Negative for dysuria.  Musculoskeletal: Negative for back pain.  All other systems reviewed and are negative.   Allergies  Review of patient's allergies indicates no known allergies.  Home Medications   Prior to Admission medications   Medication Sig Start Date End Date Taking? Authorizing Provider  aspirin-acetaminophen-caffeine (EXCEDRIN MIGRAINE) [phone] MG per tablet Take 2 tablets by mouth every 6 (six) hours as needed for headache.    Yes Historical Provider, MD  citalopram (CELEXA) 40 MG tablet Take 1 tablet (40 mg total) by mouth daily. 08/23/13  Yes Gay Filler Copland, MD  clonazePAM (KLONOPIN) 0.5 MG tablet Take 1/2 or 1 tablet every 8 hours as needed for anxiety.  This will be your last rx from my office.  Please contact your medicaid provider 10/10/13  Yes Darreld Mclean, MD  omeprazole (PRILOSEC) 20 MG capsule Take 1 capsule (20 mg total) by mouth daily. 09/09/14   Merryl Hacker, MD   BP 117/83 mmHg  Pulse 82  Resp 19  SpO2 99%  LMP 08/17/2014 Physical Exam  Constitutional: Norma Goodman is oriented to person, place, and time. Norma Goodman appears well-developed and well-nourished. No distress.  HENT:  Head: Normocephalic and atraumatic.  Eyes: Pupils are equal, round, and reactive to light.  Neck: Neck supple.  Cardiovascular: Normal rate, regular rhythm and normal heart sounds.   Pulmonary/Chest: Effort normal and breath sounds normal. No respiratory distress. Norma Goodman has no wheezes. Norma Goodman exhibits no tenderness.  Abdominal: Soft. Bowel sounds are normal. There is no tenderness. There is no rebound.  Neurological: Norma Goodman is alert and oriented to person, place, and time.  Skin: Skin is warm and dry.  Psychiatric: Norma Goodman has a normal mood and affect.  Nursing note and vitals  reviewed.   ED Course  Procedures   DIAGNOSTIC STUDIES: Oxygen Saturation is 98% on RA, normal by my interpretation.    COORDINATION OF CARE: 2:44 AM Discussed treatment plan with pt at bedside and pt agreed to plan.   Labs Review Labs Reviewed  CBC - Abnormal; Notable for the following:    WBC 17.8 (*)    Platelets 497 (*)    All other components within normal limits  BRAIN NATRIURETIC PEPTIDE - Abnormal; Notable for the following:    B Natriuretic Peptide 342.8 (*)    All other components within normal limits  BASIC METABOLIC PANEL  I-STAT TROPOININ, ED  POC URINE PREG, ED    Imaging Review Dg Chest 2 View  09/09/2014   CLINICAL DATA:  Centralized chest pain tonight. Shortness of breath.  EXAM: CHEST  2 VIEW  COMPARISON:  None.  FINDINGS: The cardiomediastinal contours are normal. Increase central bronchitic markings. Minimal atelectasis at the left lung base. Pulmonary vasculature is normal. No consolidation, pleural effusion, or pneumothorax. No acute osseous abnormalities are seen.  IMPRESSION: Increased central bronchial markings, may reflect bronchitis or asthma. Minimal atelectasis at the left lung base.   Electronically Signed   By: Jeb Levering M.D.   On: 09/09/2014 02:06     EKG Interpretation None      MDM   Final diagnoses:  Epigastric pain   Patient presents with chest and epigastric pain. Onset prior to arrival. No exacerbating or relieving factors. No shortness of breath or cough. Denies fevers. Patient does report recent history of blood in stool. Norma Goodman is being referred by her primary physician to a GI doctor. Norma Goodman takes Excedrin Migraine almost daily and would be a risk for gastritis versus peptic ulcer disease. Hemoglobin stable. Patient given GI cocktail and Protonix. Basic lab work, EKG, chest x-ray reassuring. Low risk for ACS. Doubt PE. Patient reports improvement with GI cocktail. Discussed with patient supportive care and PPI at home. Patient  stated understanding.  After history, exam, and medical workup I feel the patient has been appropriately medically screened and is safe for discharge home. Pertinent diagnoses were discussed with the patient. Patient was given return precautions.   I personally performed the services described in this documentation, which was scribed in my presence. The recorded information has been reviewed and is accurate.      Merryl Hacker, MD 09/09/14 [phone]

## 2014-09-09 NOTE — ED Notes (Signed)
Pt stable, ambulatory, states understanding of discharge instructions 

## 2014-09-09 NOTE — ED Notes (Signed)
Pt. reports central chest pain , upper back pain and left arm tingling onset this morning , mild SOB and emesis .

## 2014-09-10 ENCOUNTER — Encounter: Payer: Self-pay | Admitting: Physician Assistant

## 2014-09-25 ENCOUNTER — Encounter: Payer: Self-pay | Admitting: Physician Assistant

## 2014-09-25 ENCOUNTER — Other Ambulatory Visit (INDEPENDENT_AMBULATORY_CARE_PROVIDER_SITE_OTHER): Payer: Medicaid Other

## 2014-09-25 ENCOUNTER — Ambulatory Visit (INDEPENDENT_AMBULATORY_CARE_PROVIDER_SITE_OTHER): Payer: Medicaid Other | Admitting: Physician Assistant

## 2014-09-25 VITALS — BP 112/80 | HR 92 | Ht 62.0 in | Wt 199.4 lb

## 2014-09-25 DIAGNOSIS — R1032 Left lower quadrant pain: Secondary | ICD-10-CM | POA: Diagnosis not present

## 2014-09-25 DIAGNOSIS — K625 Hemorrhage of anus and rectum: Secondary | ICD-10-CM | POA: Diagnosis not present

## 2014-09-25 DIAGNOSIS — R195 Other fecal abnormalities: Secondary | ICD-10-CM

## 2014-09-25 DIAGNOSIS — K219 Gastro-esophageal reflux disease without esophagitis: Secondary | ICD-10-CM | POA: Diagnosis not present

## 2014-09-25 DIAGNOSIS — R1013 Epigastric pain: Secondary | ICD-10-CM

## 2014-09-25 LAB — HIGH SENSITIVITY CRP: CRP, High Sensitivity: 3.41 mg/L (ref 0.000–5.000)

## 2014-09-25 LAB — H. PYLORI ANTIBODY, IGG: H PYLORI IGG: NEGATIVE

## 2014-09-25 LAB — SEDIMENTATION RATE: Sed Rate: 18 mm/hr (ref 0–22)

## 2014-09-25 MED ORDER — OMEPRAZOLE 20 MG PO CPDR: DELAYED_RELEASE_CAPSULE | ORAL | Status: DC

## 2014-09-25 NOTE — Progress Notes (Signed)
Reviewed and agree with management. Robert D. Kaplan, M.D., FACG  

## 2014-09-25 NOTE — Progress Notes (Signed)
Patient ID: Norma Goodman, female   DOB: 15-Jul-1985, 29 y.o.   MRN: [id number]   Subjective:    Patient ID: Norma Goodman, female    DOB: 09-Sep-1985, 29 y.o.   MRN: [id number]  HPI Norma Goodman is a pleasant 29 year old white female new to GI referred by [facility] primary care Raelyn Number PA for evaluation of intermittent hematochezia or change in bowel habits and abdominal pain. He had an ER visit on 09/09/2014 with upper abdominal pain. At that time her WBC was 17.8, hemoglobin 13.6 hematocrit of 40.5. She was started on Prilosec 20 mg by mouth daily area was noted that she had been taking a lot of Excedrin. She said she had been taking up to 7 Excedrin per day for chronic headaches and has now cut back to 2 per day. Her parents are not happy that she's been taking so much medication for headaches. She says that over the past year she has been having rectal bleeding which is been intermittent and occurring at least a couple of times per week. This is generally bright red blood with a bowel movement and after a bowel movement and she says the blood will be mixed in with stool. She has not noted any melena or hematochezia. She is also been having more frequent looser stools with 4-5 bowel movements per day. She complains of bilateral lower quadrant abdominal discomfort and pain around into her back. Her appetite is been okay her weight has been stable. Norma Goodman She has not had any prior abdominal surgery, family history is negative for inflammatory bowel disease and colon cancer.  Review of Systems Pertinent positive and negative review of systems were noted in the above HPI section.  All other review of systems was otherwise negative.  Outpatient Encounter Prescriptions as of 09/25/2014  Medication Sig  . aspirin-acetaminophen-caffeine (EXCEDRIN MIGRAINE) 782-956-21 MG per tablet Take 2 tablets by mouth every 6 (six) hours as needed for headache.   . citalopram (CELEXA) 40 MG tablet Take 1 tablet  (40 mg total) by mouth daily.  . clonazePAM (KLONOPIN) 0.5 MG tablet Take 1/2 or 1 tablet every 8 hours as needed for anxiety.  This will be your last rx from my office.  Please contact your medicaid provider  . omeprazole (PRILOSEC) 20 MG capsule Take 1 tab by mouth every morning.  . [DISCONTINUED] omeprazole (PRILOSEC) 20 MG capsule Take 1 capsule (20 mg total) by mouth daily.  . [DISCONTINUED] omeprazole (PRILOSEC) 20 MG capsule Take 1 tab by mouth every morning.   No facility-administered encounter medications on file as of 09/25/2014.   No Known Allergies Patient Active Problem List   Diagnosis Date Noted  . Anxiety state, unspecified 04/24/2012   History   Social History  . Marital Status: Single    Spouse Name: N/A  . Number of Children: N/A  . Years of Education: N/A   Occupational History  . Not on file.   Social History Main Topics  . Smoking status: Light Tobacco Smoker  . Smokeless tobacco: Not on file  . Alcohol Use: No  . Drug Use: No  . Sexual Activity: Not on file   Other Topics Concern  . Not on file   Social History Narrative    Ms. Manor's family history includes Diabetes in her father; Heart disease in her father; Hypertension in her father.      Objective:    Filed Vitals:   09/25/14 0853  BP: 112/80  Pulse: 92    Physical  Exam   well-developed white female in no acute distress, accompanied by mother and father blood pressure 112/80 pulse 92 height 5 foot 2 weight 199 and HEENT; nontraumatic normocephalic EOMI PERRLA sclera anicteric, Supple; no JVD, Cardiovascular ;regular rate and rhythm with S1-S2 no murmur or gallop, Pulmonary ;clear bilaterally, Abdomen; is soft she is tender in the left mid and left lower quadrant there is no guarding or rebound no palpable mass or hepatosplenomegaly bowel sounds are present Rectal exam not done, Extremities ;no clubbing cyanosis or edema skin warm and dry, Neuropsych ;mood and affect appropriate she is  anxious       Assessment & Plan:   #1 29 yo female with one year hx of lower abdominal cramping, discomfort , intermittent BRB mixed with stool, , and loose more frequent Bm's- will r/o IBD #2 epigastric pain, heartburn- 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 Migraines #4 obesity  Plan; Continue Prilosec 20 mg po QM Stop Exedrin PT [facility] Will schedule for EGD and Colonoscopy with Dr. Deatra Ina. Procedures discussed in detail with pt and she is agreeable to proceed CBC, CMET, ESR,CRP   Amy S Esterwood PA-C 09/25/2014   Cc: No ref. provider found

## 2014-09-25 NOTE — Patient Instructions (Addendum)
Please go to the basement level to have your labs drawn.  Take Omeprzole ( prilosec) 20 mg every morning,   We sent refills to [facility]. [address].   Get an appointment at the [facility]- [phone].  You have been scheduled for an endoscopy and colonoscopy. Please follow the written instructions given to you at your visit today. Please pick up your prep supplies at the pharmacy within the next 1-3 days. If you use inhalers (even only as needed), please bring them with you on the day of your procedure. Your physician has requested that you go to www.startemmi.com and enter the access code given to you at your visit today. This web site gives a general overview about your procedure. However, you should still follow specific instructions given to you by our office regarding your preparation for the procedure.    Normal BMI (Body Mass Index- based on height and weight) is between 19 and 25. Your BMI today is Body mass index is 36.46 kg/(m^2). Marland Kitchen Please consider follow up  regarding your BMI with your Primary Care Provider.

## 2014-10-13 ENCOUNTER — Telehealth: Payer: Self-pay | Admitting: Gastroenterology

## 2014-10-13 MED ORDER — NA SULFATE-K SULFATE-MG SULF 17.5-3.13-1.6 GM/177ML PO SOLN: 1.0000 | Freq: Once | ORAL | Status: DC

## 2014-10-13 NOTE — Telephone Encounter (Signed)
Suprep sent to [facility]   L/M for patient that prep was sent

## 2014-10-13 NOTE — Telephone Encounter (Signed)
patient called in stating that she ate pork and beans a couple of days ago and wants to know if she can still have her procedure tomorrow. I spoke to Randye Lobo in [facility] and she states that is fine and to drink plenty of water.

## 2014-10-14 ENCOUNTER — Telehealth: Payer: Self-pay | Admitting: Gastroenterology

## 2014-10-14 ENCOUNTER — Encounter: Payer: Medicaid Other | Admitting: Gastroenterology

## 2014-10-14 NOTE — Telephone Encounter (Signed)
Spoke with the patient. She took half of her prep and began vomiting a few hours later. She is still vomiting. She reports clear bowel movements. She declines to come in for her procedure stating she is afraid to be "put to sleep" when she is "so sick". Declines medication for nausea. She wants to see Nicoletta Ba again instead of proceeding with the colonoscopy. Discussed rehydration for today.

## 2014-10-19 ENCOUNTER — Ambulatory Visit (INDEPENDENT_AMBULATORY_CARE_PROVIDER_SITE_OTHER): Payer: Medicaid Other | Admitting: Physician Assistant

## 2014-10-19 ENCOUNTER — Encounter: Payer: Self-pay | Admitting: Physician Assistant

## 2014-10-19 VITALS — BP 108/80 | HR 88 | Ht 63.0 in | Wt 200.4 lb

## 2014-10-19 DIAGNOSIS — K625 Hemorrhage of anus and rectum: Secondary | ICD-10-CM

## 2014-10-19 DIAGNOSIS — R51 Headache: Secondary | ICD-10-CM

## 2014-10-19 DIAGNOSIS — R1013 Epigastric pain: Secondary | ICD-10-CM

## 2014-10-19 DIAGNOSIS — R194 Change in bowel habit: Secondary | ICD-10-CM

## 2014-10-19 DIAGNOSIS — R519 Headache, unspecified: Secondary | ICD-10-CM

## 2014-10-19 MED ORDER — ONDANSETRON 4 MG PO TBDP: ORAL_TABLET | ORAL | Status: DC

## 2014-10-19 MED ORDER — CLONAZEPAM 0.5 MG PO TABS: ORAL_TABLET | ORAL | Status: DC

## 2014-10-19 MED ORDER — MOVIPREP 100 G PO SOLR: 1.0000 | Freq: Once | ORAL | Status: DC

## 2014-10-19 NOTE — Patient Instructions (Signed)
You have been scheduled for an endoscopy and colonoscopy. Please follow the written instructions given to you at your visit today. Please pick up your prep supplies at the pharmacy within the next 1-3 days. If you use inhalers (even only as needed), please bring them with you on the day of your procedure. Your physician has requested that you go to www.startemmi.com and enter the access code given to you at your visit today. This web site gives a general overview about your procedure. However, you should still follow specific instructions given to you by our office regarding your preparation for the procedure.  We have sent the following medications to your pharmacy for you to pick up at your convenience: Prilosec 20 mg every morning Klonopin 0.5 mg every morning as needed. We will provide #30 with 1 refill. After that, you must get refills from your primary care providers.  [facility]'s phone number is [phone]

## 2014-10-19 NOTE — Progress Notes (Signed)
Patient ID: Norma Goodman, female   DOB: 1986/03/03, 29 y.o.   MRN: [id number]   Subjective:    Patient ID: Norma Goodman, female    DOB: 1985-10-07, 29 y.o.   MRN: [id number]  HPI Norma Goodman is a pleasant 29 year old white female, new to our practice as of last office visit 09/25/2014. She had been referred [facility] for evaluation of intermittent rectal bleeding, change in bowel habits and abdominal pain. She complained of 1 year history of lower abdominal cramping and discomfort with frequent loose stools and intermittent bright red blood. She was also complaining of epigastric pain and heartburn. Her parents were concerned because she has been taking a lot of Excedrin on a daily basis for headaches. She was advised to significantly decrease Excedrin use, was started on Prilosec 20 mg by mouth daily, also advised to get appointment with the [facility]. We scheduled her for EGD and colonoscopy with Dr. Deatra Ina. She also had baseline labs including sedimentation rate and CRP which were unremarkable. Patient comes back in today again with her parents stating that she had a reaction to the bowel prep with Suprep and that she was unable to complete the prep that she developed headache nausea vomiting and may have had a syncopal episode at home and vomiting. She has cut back to 2 Excedrin per day which she says she's doing fine with she continues to take Prilosec and feels that her epigastric pain has improved somewhat. Her bowel symptoms are the same and she continues with intermittent bright red blood per rectum. Her father is interested to know if there is any other way to evaluate her other than colonoscopy for rectal bleeding.   Review of Systems Pertinent positive and negative review of systems were noted in the above HPI section.  All other review of systems was otherwise negative.  Outpatient Encounter Prescriptions as of 10/19/2014  Medication Sig  .  aspirin-acetaminophen-caffeine (EXCEDRIN MIGRAINE) [phone] MG per tablet Take 2 tablets by mouth every 6 (six) hours as needed for headache.   . citalopram (CELEXA) 40 MG tablet Take 1 tablet (40 mg total) by mouth daily.  . clonazePAM (KLONOPIN) 0.5 MG tablet Take 1 tablet by mouth every morning as needed. Further refills from PCP  . MOVIPREP 100 G SOLR Take 1 kit (200 g total) by mouth once.  Marland Kitchen omeprazole (PRILOSEC) 20 MG capsule Take 1 tab by mouth every morning.  . [DISCONTINUED] clonazePAM (KLONOPIN) 0.5 MG tablet Take 0.5 mg by mouth 2 (two) times daily as needed for anxiety.  . [DISCONTINUED] clonazePAM (KLONOPIN) 0.5 MG tablet Take 1/2 or 1 tablet every 8 hours as needed for anxiety.  This will be your last rx from my office.  Please contact your medicaid provider  . [DISCONTINUED] Na Sulfate-K Sulfate-Mg Sulf (SUPREP BOWEL PREP) SOLN Take 1 kit by mouth once.   No facility-administered encounter medications on file as of 10/19/2014.   No Known Allergies Patient Active Problem List   Diagnosis Date Noted  . Rectal bleeding 10/19/2014  . Frequent headaches 10/19/2014  . Anxiety state, unspecified 04/24/2012   History   Social History  . Marital Status: Single    Spouse Name: N/A  . Number of Children: 1  . Years of Education: N/A   Occupational History  . Not on file.   Social History Main Topics  . Smoking status: Light Tobacco Smoker  . Smokeless tobacco: Never Used  . Alcohol Use: No  . Drug Use:  No  . Sexual Activity: Not on file   Other Topics Concern  . Not on file   Social History Narrative    Norma Goodman's family history includes Diabetes in her father; Heart disease in her father; Hypertension in her father.      Objective:    Filed Vitals:   10/19/14 1319  BP: 108/80  Pulse: 88    Physical Exam  well-developed young white female in no acute distress, accompanied by both parents, blood pressure 108/80, pulse 88, height 5 foot 3, weight 200.  HEENT nontraumatic normocephalic EOMI PERRLA sclera anicteric not further examined today discussion only        Assessment & Plan:   #1 29 yo female with one year hx of intermittent rectal bleeding, frequent loose stools, and c/o epigastric pain, lower abdominal  Cramping- r/o IBD, R/O  ASA induced PUD/gastropathy #2 adverse rxn to bowel prep - Suprep #3 chronic anxiety  #4 daily headaches  Plan; continue to wean  off Exedrin  Continue Prilosec 20 mg po daily Reschedule for EGD and Colonoscopy with Dr Deatra Ina- procedures again discussed with pt and parents- will use a Moviprep Also  refill Klonopin 0.5 mg qam prn x one month- as she is out and plans to change PCP's Call in Zofran 4 mg q6 hours to assist with nausea with bowel prep if needed.   Wilkes Potvin S Adrina Armijo PA-C 10/19/2014   Cc: No ref. provider found

## 2014-10-19 NOTE — Addendum Note (Signed)
Addended by: Larina Bras on: 10/19/2014 03:00 PM   Modules accepted: Orders

## 2014-10-20 NOTE — Progress Notes (Signed)
Reviewed and agree with management. Denys Labree D. Martice Doty, M.D., FACG  

## 2014-12-12 ENCOUNTER — Other Ambulatory Visit: Payer: Self-pay | Admitting: Physician Assistant

## 2014-12-21 ENCOUNTER — Encounter: Payer: Medicaid Other | Admitting: Gastroenterology

## 2014-12-24 ENCOUNTER — Telehealth: Payer: Self-pay | Admitting: Gastroenterology

## 2014-12-24 NOTE — Telephone Encounter (Signed)
Called and clarified instructions with patient.

## 2014-12-25 ENCOUNTER — Ambulatory Visit (AMBULATORY_SURGERY_CENTER): Payer: Medicaid Other | Admitting: Gastroenterology

## 2014-12-25 ENCOUNTER — Encounter: Payer: Self-pay | Admitting: Gastroenterology

## 2014-12-25 VITALS — BP 121/73 | HR 84 | Temp 98.7°F | Resp 16 | Ht 63.0 in | Wt 200.0 lb

## 2014-12-25 DIAGNOSIS — R112 Nausea with vomiting, unspecified: Secondary | ICD-10-CM

## 2014-12-25 DIAGNOSIS — K2970 Gastritis, unspecified, without bleeding: Secondary | ICD-10-CM

## 2014-12-25 DIAGNOSIS — R1013 Epigastric pain: Secondary | ICD-10-CM | POA: Diagnosis not present

## 2014-12-25 MED ORDER — SODIUM CHLORIDE 0.9 % IV SOLN: 500.0000 mL | INTRAVENOUS | Status: DC

## 2014-12-25 MED ORDER — ONDANSETRON HCL 4 MG PO TABS
4.0000 mg | ORAL_TABLET | Freq: Once | ORAL | Status: AC
  Administered 2014-12-25: 4 mg via ORAL

## 2014-12-25 NOTE — Progress Notes (Signed)
Zofran 4mg  given by mouth in recovery for nausea. Patient suffers with migraines. Experience nausea along with headaches. Instructed to call and schedule an appointment to see a primary care doctor. Telephone number given for primary care to contact for an appointment.

## 2014-12-25 NOTE — Patient Instructions (Signed)
Discharge instructions given. Biopsies taken. Avoid NSAIDS. Resume previous medications. YOU HAD AN ENDOSCOPIC PROCEDURE TODAY AT THE [facility]:   Refer to the procedure report that was given to you for any specific questions about what was found during the examination.  If the procedure report does not answer your questions, please call your gastroenterologist to clarify.  If you requested that your care partner not be given the details of your procedure findings, then the procedure report has been included in a sealed envelope for you to review at your convenience later.  YOU SHOULD EXPECT: Some feelings of bloating in the abdomen. Passage of more gas than usual.  Walking can help get rid of the air that was put into your GI tract during the procedure and reduce the bloating. If you had a lower endoscopy (such as a colonoscopy or flexible sigmoidoscopy) you may notice spotting of blood in your stool or on the toilet paper. If you underwent a bowel prep for your procedure, you may not have a normal bowel movement for a few days.  Please Note:  You might notice some irritation and congestion in your nose or some drainage.  This is from the oxygen used during your procedure.  There is no need for concern and it should clear up in a day or so.  SYMPTOMS TO REPORT IMMEDIATELY:    Following upper endoscopy (EGD)  Vomiting of blood or coffee ground material  New chest pain or pain under the shoulder blades  Painful or persistently difficult swallowing  New shortness of breath  Fever of 100F or higher  Black, tarry-looking stools  For urgent or emergent issues, a gastroenterologist can be reached at any hour by calling [phone].   DIET: Your first meal following the procedure should be a small meal and then it is ok to progress to your normal diet. Heavy or fried foods are harder to digest and may make you feel nauseous or bloated.  Likewise, meals heavy in dairy and  vegetables can increase bloating.  Drink plenty of fluids but you should avoid alcoholic beverages for 24 hours.  ACTIVITY:  You should plan to take it easy for the rest of today and you should NOT DRIVE or use heavy machinery until tomorrow (because of the sedation medicines used during the test).    FOLLOW UP: Our staff will call the number listed on your records the next business day following your procedure to check on you and address any questions or concerns that you may have regarding the information given to you following your procedure. If we do not reach you, we will leave a message.  However, if you are feeling well and you are not experiencing any problems, there is no need to return our call.  We will assume that you have returned to your regular daily activities without incident.  If any biopsies were taken you will be contacted by phone or by letter within the next 1-3 weeks.  Please call us at [phone] if you have not heard about the biopsies in 3 weeks.    SIGNATURES/CONFIDENTIALITY: You and/or your care partner have signed paperwork which will be entered into your electronic medical record.  These signatures attest to the fact that that the information above on your After Visit Summary has been reviewed and is understood.  Full responsibility of the confidentiality of this discharge information lies with you and/or your care-partner.  

## 2014-12-25 NOTE — Progress Notes (Signed)
Called to room to assist during endoscopic procedure.  Patient ID and intended procedure confirmed with present staff. Received instructions for my participation in the procedure from the performing physician.  

## 2014-12-25 NOTE — Op Note (Signed)
[facility]  [address]. [place], [address]   ENDOSCOPY PROCEDURE REPORT  PATIENT: Norma Goodman, Norma Goodman  MR#: [id number] BIRTHDATE: [birth year] , 29  yrs. old GENDER: female ENDOSCOPIST: Yetta Flock, MD REFERRED BY: PROCEDURE DATE:  12/25/2014 PROCEDURE:  EGD w/ biopsy ASA CLASS:     Class II INDICATIONS:  epigastric pain and diarrhea. MEDICATIONS: Propofol 400 mg IV TOPICAL ANESTHETIC:  DESCRIPTION OF PROCEDURE: After the risks benefits and alternatives of the procedure were thoroughly explained, informed consent was obtained.  The LB [id number] endoscope was introduced through the mouth and advanced to the second portion of the duodenum , Without limitations.  The instrument was slowly withdrawn as the mucosa was fully examined.  FINDINGS:The esophagus appeared normal.  DH, GEJ, and SCJ located 38cm from the incisors.  There was erythematous gastropathy with some erosions noted in the gastric antrum, without focal ulceration.  The remainder of the examined stomach was normal. Biopsies were taken of the antrum and body to rule out H pylori. The duodenal bulb and 2nd portion of the duodenum were normal otherwise.  Biopsies taken to rule out celiac disease.  Retroflexed views revealed no abnormalities.     The scope was then withdrawn from the patient and the procedure completed. COMPLICATIONS: There were no immediate complications.  ENDOSCOPIC IMPRESSION: Normal esophagus Erosive gastritis in the antrum, suspect due to NSAIDs +/- H pylori. Biopsies obtained Normal duodenum - biopsies obtained to rule out celiac disease       RECOMMENDATIONS: Resume diet Resume medications Increase omeprazole from 20mg  daily to twice daily No NSAIDs (Excedrin) Tylenol is okay for headaches Reschedule colonoscopy at your convenience if bowel symptoms persist. Recommend gatorade + miralax bowel preparation given intolerance of prior bowel  preparation.    eSigned:  Yetta Flock, MD 12/25/2014 3:10 PM    CC: the patient  PATIENT NAME:  Norma Goodman, Norma Goodman MR#: [id number]

## 2014-12-25 NOTE — Progress Notes (Signed)
Transferred to recovery room. A/O x3, pleased with MAC.  VSS.  Report to Celia, RN. 

## 2014-12-28 ENCOUNTER — Telehealth: Payer: Self-pay | Admitting: *Deleted

## 2014-12-28 NOTE — Telephone Encounter (Signed)
No answer, message left for the patient. 

## 2014-12-31 ENCOUNTER — Other Ambulatory Visit: Payer: Self-pay

## 2014-12-31 MED ORDER — OMEPRAZOLE 20 MG PO CPDR: 20.0000 mg | DELAYED_RELEASE_CAPSULE | Freq: Two times a day (BID) | ORAL | Status: DC

## 2015-01-25 ENCOUNTER — Ambulatory Visit (INDEPENDENT_AMBULATORY_CARE_PROVIDER_SITE_OTHER): Payer: Self-pay | Admitting: Family Medicine

## 2015-01-25 VITALS — BP 122/72 | HR 96 | Temp 98.0°F | Resp 17 | Ht 61.5 in | Wt 188.0 lb

## 2015-01-25 DIAGNOSIS — F411 Generalized anxiety disorder: Secondary | ICD-10-CM

## 2015-01-25 MED ORDER — PAROXETINE HCL 20 MG PO TABS: 20.0000 mg | ORAL_TABLET | Freq: Every day | ORAL | Status: DC

## 2015-01-25 MED ORDER — CLONAZEPAM 0.5 MG PO TABS: 0.5000 mg | ORAL_TABLET | Freq: Two times a day (BID) | ORAL | Status: DC | PRN

## 2015-01-25 NOTE — Progress Notes (Signed)
[facility] [address] [place] [address] [phone]  Date:  01/25/2015   Name:  Norma Goodman   DOB:  1985-10-30   MRN:  [id number]  PCP:  Benito Mccreedy, MD    Chief Complaint: Medication Refill; Depression; and Anxiety   History of Present Illness:  Norma Goodman is a 29 y.o. very pleasant female patient who presents with the following:  I have not seen this pt in over a year.  She was seen here over the summer with complaint of blood in her stool- she has been seen by GI.  She had an endoscopy which showed that my stomach was irritated from excedrin use.  They stopped the excedrin and she was started on rixatriptan for her migraine HA.    She states that she is here today due to "the same thing, bad anxiety, I don't feel like doing anything, I'm always worried and stressed."    She is still taking celexa- sh has been taking 60 mg for about 6 weeks.  She increased her dose on her own, but it has not seemed to help her.   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   She does have some other mom's who she sees for play dates, etc on occasion but is somewhat isolated  She is still smoking about 1/2 to 1 ppd No alcohol, no other drugs.    She feels that she is "just constantly crying and worried"  She has not used prozac/ wellbutrin, other antidepressants in the past that she can recall  She does get some exercise when she can She does have medicaid but needs to get a new MD assigned  Wt Readings from Last 3 Encounters:  01/25/15 188 lb (85.276 kg)  12/25/14 200 lb (90.719 kg)  10/19/14 200 lb 6 oz (90.89 kg)    She was seeing someone at [facility] but she does not want to see them any longer She has gotten klonpin a few times over the last 6 months from her old dr in [facility] and also Ms. Easterwood at GI  Recent EKG showed a qtc of  457  Patient Active Problem List   Diagnosis Date Noted  . Rectal bleeding 10/19/2014  . Frequent headaches 10/19/2014  . Anxiety state, unspecified 04/24/2012    Past Medical History  Diagnosis Date  . Anxiety   . Depression     No past surgical history on file.  Social History  Substance Use Topics  . Smoking status: Heavy Tobacco Smoker -- 1.00 packs/day for 9 years    Types: Cigarettes  . Smokeless tobacco: Never Used  . Alcohol Use: No    Family History  Problem Relation Age of Onset  . Diabetes Father   . Hypertension Father   . Heart disease Father     No Known Allergies  Medication list has been reviewed and updated.  Current Outpatient Prescriptions on File Prior to Visit  Medication Sig Dispense Refill  . citalopram (CELEXA) 40 MG tablet Take 1 tablet (40 mg total) by mouth daily. 90 tablet 1  . omeprazole (PRILOSEC) 20 MG capsule Take 1 capsule (20 mg total) by mouth 2 (two) times daily before a meal. 60 capsule 11  . aspirin-acetaminophen-caffeine (EXCEDRIN MIGRAINE) 250-250-65 MG per tablet Take 2 tablets by mouth every 6 (six) hours as needed for  headache.     . ondansetron (ZOFRAN ODT) 4 MG disintegrating tablet Take 1 tablet 1 hour prior to bowel prep and every 6 hours as needed thereafter. (Patient not taking: Reported on 01/25/2015) 4 tablet 0   No current facility-administered medications on file prior to visit.    Review of Systems:  As per HPI- otherwise negative.   Physical Examination: Filed Vitals:   01/25/15 1429  BP: 122/72  Pulse: 111  Temp: 98 F (36.7 C)  Resp: 17   Filed Vitals:   01/25/15 1429  Height: 5' 1.5" (1.562 m)  Weight: 188 lb (85.276 kg)   Body mass index is 34.95 kg/(m^2). Ideal Body Weight: Weight in (lb) to have BMI = 25: 134.2  GEN: WDWN, NAD, Non-toxic, A & O x 3, obese, appears her normal self.  She has hirsutism, tobacco smell HEENT: Atraumatic, Normocephalic. Neck supple. No masses, No LAD. Ears and  Nose: No external deformity. CV: RRR, No M/G/R. No JVD. No thrill. No extra heart sounds. PULM: CTA B, no wheezes, crackles, rhonchi. No retractions. No resp. distress. No accessory muscle use. EXTR: No c/c/e NEURO Normal gait.  PSYCH: Normally interactive. Conversant. Appears somewhat anxius   Assessment and Plan: Generalized anxiety disorder - Plan: clonazePAM (KLONOPIN) 0.5 MG tablet, PARoxetine (PAXIL) 20 MG tablet  Here today to discuss anxiety and depression.  Encouraged her to engage in more social activities and to keep to a regular schedule. Also encouraged her to stop smoking Will try changing from celexa to paxil for her anxiety.  Discussed tapering the celexa and changing over to paxil.  Also did refill her klonopin Plan to recheck with her in 6 weeks unless she is able to see her new medicaid provider by then  Signed Lamar Blinks, MD

## 2015-01-25 NOTE — Patient Instructions (Signed)
Let's have you stop the celexa and change over to paxil which may be better for your anxiety Cut down to 40mg  of celexa for one week, then 20 mg of celexa for 2 weeks, and then stop.  You can then start 20 mg of paxil- increase to 40mg  over 2 weeks Use the klonpin as needed for anxiety but remember this is a habit forming and sedating medication- use as little as you can  Please try to quit smoking- this is really important for your health and also for your daughter's health  Please find out who is your new medicaid MD and see them soon for a recheck However if you are not able to see them within 6 weeks please come and see me to check in on your progress.   Let me know if you are getting worse or having any other concerns

## 2015-03-02 ENCOUNTER — Ambulatory Visit (INDEPENDENT_AMBULATORY_CARE_PROVIDER_SITE_OTHER): Payer: Self-pay | Admitting: Family Medicine

## 2015-03-02 VITALS — BP 126/80 | HR 86 | Temp 98.2°F | Resp 17 | Ht 63.5 in | Wt 189.0 lb

## 2015-03-02 DIAGNOSIS — F3341 Major depressive disorder, recurrent, in partial remission: Secondary | ICD-10-CM

## 2015-03-02 DIAGNOSIS — F411 Generalized anxiety disorder: Secondary | ICD-10-CM

## 2015-03-02 DIAGNOSIS — R635 Abnormal weight gain: Secondary | ICD-10-CM

## 2015-03-02 MED ORDER — CLONAZEPAM 0.5 MG PO TABS: 0.5000 mg | ORAL_TABLET | Freq: Two times a day (BID) | ORAL | Status: DC | PRN

## 2015-03-02 NOTE — Patient Instructions (Signed)
It was good to see you today- I am glad that you are doing a bit better!   I will be in touch with your thyroid results asap For the time being continue taking your paxil for anxiety and depression.   Continue to use the klonopin as needed for anxiety- however the less you can use this the better. Exercise will help your mood, sleep and weight/ general health.  I would recommend that you join a gym that has fun, group exercise classes if you can.  This can be an enjoyable way to socialize and exercise harder than you would on your own.    Please give me a call in a month or so and let me know how you are doing- Sooner if worse.

## 2015-03-02 NOTE — Progress Notes (Signed)
[facility] [address] [place] [address] [phone]  Date:  03/02/2015   Name:  Norma Goodman   DOB:  Aug 08, 1985   MRN:  [id number]  PCP:  Benito Mccreedy, MD    Chief Complaint: Medication Refill and Depression   History of Present Illness:  Norma Goodman is a 29 y.o. very pleasant female patient who presents with the following:  Here today to recheck anxiety and depression.  I have seen her off an on over the years- last seen a month ago.  At that visit we planned to change her to paxil from celexa for her anxiety and refilled her klonopin.  She does have medicaid but was not aware of her medicaid provider at her last visit.    We tapered her celexa and then started on paxil- she started taking 40 mg of paxil about 10 days ago.  She feels that she is slightly better, however her sx do persist Her energy level is "not very good."  She feels that she is "not really hungry."  Her anxiety "has been hard."     In the morning and at night she is more anxious.  She does better during the middle of the day.  She has been shopping a lot for christmas right now. She is walking for exercise.  She is working on quitting smoking.    LMP was last week.  She has not yet found out who is her medicaid provider.  She plans to do so however and understands that she needs to identify this person in order to get any needed referrals, etc.  She will do so  Here with her father today Renly does have a supportive family.  She states that anxiety and depression have bothered her for the last 4 years or so- she is not aware of any inciting event   She denies any SI Her 5 yo daughter is doing well  Wt Readings from Last 3 Encounters:  03/02/15 189 lb (85.73 kg)  01/25/15 188 lb (85.276 kg)  12/25/14 200 lb (90.719 kg)       Patient Active Problem List   Diagnosis Date Noted  . Rectal bleeding 10/19/2014  . Frequent headaches 10/19/2014  . Anxiety state,  unspecified 04/24/2012    Past Medical History  Diagnosis Date  . Anxiety   . Depression     No past surgical history on file.  Social History  Substance Use Topics  . Smoking status: Heavy Tobacco Smoker -- 1.00 packs/day for 9 years    Types: Cigarettes  . Smokeless tobacco: Never Used  . Alcohol Use: No    Family History  Problem Relation Age of Onset  . Diabetes Father   . Hypertension Father   . Heart disease Father     No Known Allergies  Medication list has been reviewed and updated.  Current Outpatient Prescriptions on File Prior to Visit  Medication Sig Dispense Refill  . aspirin-acetaminophen-caffeine (EXCEDRIN MIGRAINE) 250-250-65 MG per tablet Take 2 tablets by mouth every 6 (six) hours as needed for headache.     . clonazePAM (KLONOPIN) 0.5 MG tablet Take 1 tablet (0.5 mg total) by mouth 2 (two) times daily as needed for anxiety. 60 tablet 0  . omeprazole (PRILOSEC) 20 MG capsule Take 1 capsule (20 mg total) by mouth 2 (two) times daily before a meal. 60 capsule 11  . PARoxetine (PAXIL) 20 MG tablet Take 1 tablet (20 mg total) by mouth daily.  Increase to 1.5 pills after one week and 2 pills after 2 weeks 60 tablet 3   No current facility-administered medications on file prior to visit.    Review of Systems:  As per HPI- otherwise negative. She endorses regular menses   Physical Examination: Filed Vitals:   03/02/15 1311  BP: 126/80  Pulse: 86  Temp: 98.2 F (36.8 C)  Resp: 17   Filed Vitals:   03/02/15 1311  Height: 5' 3.5" (1.613 m)  Weight: 189 lb (85.73 kg)   Body mass index is 32.95 kg/(m^2). Ideal Body Weight: Weight in (lb) to have BMI = 25: 143.1  GEN: WDWN, NAD, Non-toxic, A & O x 3, obese, looks well. Hirsutism   HEENT: Atraumatic, Normocephalic. Neck supple. No masses, No LAD. Ears and Nose: No external deformity. CV: RRR, No M/G/R. No JVD. No thrill. No extra heart sounds. PULM: CTA B, no wheezes, crackles, rhonchi. No  retractions. No resp. distress. No accessory muscle use. EXTR: No c/c/e NEURO Normal gait.  PSYCH: Normally interactive. Conversant. Not depressed or anxious appearing.  Calm demeanor.    Assessment and Plan: Generalized anxiety disorder - Plan: clonazePAM (KLONOPIN) 0.5 MG tablet  Weight gain - Plan: TSH  Recurrent major depressive disorder, in partial remission ([place])  Continue her paxil- she does feel at bit better Refilled her klonopin.  She plans to try and taper off this medication which I certainly encourage Encouraged more vigorous exercise to help with her mood and weight. She is somewhat socially isolated so I also encouraged exercise classes as a way to socialize and meet people.    Plan to recheck on the phone in a month or so  Will plan further follow- up pending labs.   Signed Lamar Blinks, MD

## 2015-03-03 ENCOUNTER — Encounter: Payer: Self-pay | Admitting: Family Medicine

## 2015-03-03 LAB — TSH: TSH: 1.072 u[IU]/mL (ref 0.350–4.500)

## 2015-06-05 ENCOUNTER — Other Ambulatory Visit: Payer: Self-pay | Admitting: Family Medicine

## 2015-06-07 ENCOUNTER — Other Ambulatory Visit: Payer: Self-pay | Admitting: Family Medicine

## 2015-06-07 NOTE — Telephone Encounter (Signed)
Reviewed [place]- no unexpected entries. Last rx for #60 klonpin (30 day supply) on 1/27 Refilled for her.  Did call her as she has medicaid- is she seeing her medicaid provider?  She states that she saw one doctor but did not like them. She is working on finding a new provider

## 2015-07-09 ENCOUNTER — Other Ambulatory Visit: Payer: Self-pay | Admitting: Family Medicine

## 2015-07-28 ENCOUNTER — Other Ambulatory Visit: Payer: Self-pay | Admitting: Family Medicine

## 2015-07-28 NOTE — Telephone Encounter (Signed)
Called her and let her know I will refill but it is time for an office visit

## 2015-07-29 ENCOUNTER — Other Ambulatory Visit: Payer: Self-pay | Admitting: Family Medicine

## 2015-08-25 ENCOUNTER — Ambulatory Visit (INDEPENDENT_AMBULATORY_CARE_PROVIDER_SITE_OTHER): Payer: Medicaid Other | Admitting: Family Medicine

## 2015-08-25 ENCOUNTER — Encounter: Payer: Self-pay | Admitting: Family Medicine

## 2015-08-25 VITALS — BP 130/86 | HR 85 | Temp 98.5°F | Ht 62.5 in | Wt 199.4 lb

## 2015-08-25 DIAGNOSIS — L700 Acne vulgaris: Secondary | ICD-10-CM

## 2015-08-25 DIAGNOSIS — F411 Generalized anxiety disorder: Secondary | ICD-10-CM

## 2015-08-25 DIAGNOSIS — R635 Abnormal weight gain: Secondary | ICD-10-CM | POA: Diagnosis not present

## 2015-08-25 DIAGNOSIS — Z609 Problem related to social environment, unspecified: Secondary | ICD-10-CM | POA: Diagnosis not present

## 2015-08-25 MED ORDER — TRETINOIN 0.1 % EX CREA: TOPICAL_CREAM | Freq: Every day | CUTANEOUS | Status: DC

## 2015-08-25 MED ORDER — PAROXETINE HCL 20 MG PO TABS: ORAL_TABLET | ORAL | Status: DC

## 2015-08-25 MED ORDER — CLONAZEPAM 0.5 MG PO TABS: 0.5000 mg | ORAL_TABLET | Freq: Two times a day (BID) | ORAL | Status: DC | PRN

## 2015-08-25 NOTE — Patient Instructions (Signed)
We will try increasing your paxil to 50 mg (2.5 pills a day) Continue klonopin as usual Please stop using the excedrin and use plain ibuprofen or tylenol only when absolutely necessary for headaches I am hopeful that this will improve your headaches after a week or so  I would encourage your to see a psychiatrist to discuss your troubles; they may have suggestions of medication changes that will help We do not have enough psychiatrists in [place]- it can be hard to find an apointment  You might try [facility] Address: [address] [place], [place] [address]  Phone: [phone]  [facility] is located in [place]: Phone: [phone]  Fax: [phone] [address], [place], [place] [address]    Please find out who is your medicaid provider- call the member services number on the back of your card and they should be able to help you  Please exercise about an hour a day

## 2015-08-25 NOTE — Progress Notes (Signed)
Pre visit review using our clinic review tool, if applicable. No additional management support is needed unless otherwise documented below in the visit note. 

## 2015-08-25 NOTE — Progress Notes (Signed)
[place] at [facility] [address] [place], [place] [address] [phone]  Date:  08/25/2015   Name:  Norma Goodman   DOB:  [birth year]   MRN:  [id number]  PCP:  Lamar Blinks, MD    Chief Complaint: Annual Exam   History of Present Illness:  Norma Goodman is a 30 y.o. very pleasant female patient who presents with the following:  Here today for a follow-up visit.  She notes that she is still having a lot of anxiety and that she can have panic attakcs.  In the mornings she often will feel shaky and afriad.  This will occur most day and has been the case for 5-6 months.  She is not aware of any changes of the stressors in her life.  She stays at home with her 80 yo daughter.  She is living with her mom and dad. I am not sure if her husband is still involved.  She is not currently working She does not drive, does have a learner's permit  She has never seen a counselor but she does discuss her issues with her mom and dad.  However she does not feel like counseling will help her much  She has never seen a psychiatrist either.    She is taking klonpin twice a day.  She feels like it makes her "a little more calm, but it just makes me tired.  I don't know if its really helping as much" She has used celexa in the past but we changed her to paxil about a year ago for uncontrolled anxiety  She does have medicaid but does not like her current provider.  I am not her medicaid provider and as such as not able to make referrals or provide other services for her. I have encouraged her to see if she can request to change her medicaid provider  She has been using a lot of excedrin migraine for HA- she takes this every day, and she has been doing this for a year.    Patient Active Problem List   Diagnosis Date Noted  . Rectal bleeding 10/19/2014  . Frequent headaches 10/19/2014  . Anxiety state, unspecified 04/24/2012    Past Medical  History  Diagnosis Date  . Anxiety   . Depression     No past surgical history on file.  Social History  Substance Use Topics  . Smoking status: Heavy Tobacco Smoker -- 1.00 packs/day for 9 years    Types: Cigarettes  . Smokeless tobacco: Never Used  . Alcohol Use: No    Family History  Problem Relation Age of Onset  . Diabetes Father   . Hypertension Father   . Heart disease Father     No Known Allergies  Medication list has been reviewed and updated.  Current Outpatient Prescriptions on File Prior to Visit  Medication Sig Dispense Refill  . aspirin-acetaminophen-caffeine (EXCEDRIN MIGRAINE) 250-250-65 MG per tablet Take 2 tablets by mouth every 6 (six) hours as needed for headache.     . clonazePAM (KLONOPIN) 0.5 MG tablet TAKE 1 TABLET BY MOUTH TWICE DAILY AS NEEDED FOR ANXIETY 60 tablet 0  . omeprazole (PRILOSEC) 20 MG capsule Take 1 capsule (20 mg total) by mouth 2 (two) times daily before a meal. 60 capsule 11  . PARoxetine (PAXIL) 20 MG tablet TAKE 1 TABLET(20 MG) BY MOUTH DAILY. INCREASE TO 1.5 PILLS AFTER 1 WEEK AND 2 PILLS AFTER 2 WEEKS 60  tablet 0   No current facility-administered medications on file prior to visit.    Review of Systems:  As per HPI- otherwise negative. They have noted weight gain "even though I don't eat much"  Wt Readings from Last 3 Encounters:  08/25/15 199 lb 6.4 oz (90.447 kg)  03/02/15 189 lb (85.73 kg)  01/25/15 188 lb (85.276 kg)      Physical Examination: Filed Vitals:   08/25/15 1606  BP: 130/86  Pulse: 85  Temp: 98.5 F (36.9 C)   Filed Vitals:   08/25/15 1606  Height: 5' 2.5" (1.588 m)  Weight: 199 lb 6.4 oz (90.447 kg)   Body mass index is 35.87 kg/(m^2). Ideal Body Weight: Weight in (lb) to have BMI = 25: 138.6  GEN: WDWN, NAD, Non-toxic, A & O x 3, obese, accompanied by both of her parents today, tobacco odor HEENT: Atraumatic, Normocephalic. Neck supple. No masses, No LAD. Ears and Nose: No external  deformity. CV: RRR, No M/G/R. No JVD. No thrill. No extra heart sounds. PULM: CTA B, no wheezes, crackles, rhonchi. No retractions. No resp. distress. No accessory muscle use. EXTR: No c/c/e NEURO Normal gait.  PSYCH: Normally interactive. Conversant. Not depressed or anxious appearing.  Calm demeanor.  She has some acne nodules, open comedones and cysts.    Assessment and Plan: Generalized anxiety disorder - Plan: PARoxetine (PAXIL) 20 MG tablet, clonazePAM (KLONOPIN) 0.5 MG tablet  Weight gain  Acne vulgaris - Plan: tretinoin (RETIN-A) 0.1 % cream  Social maladjustment  rx for retin a for her acne Norma Goodman has been doing poorly since I met her in 2014; she is still living with her parents, does not work or have a Optician, dispensing and is still having panic and depression.   Questioned her but it is not clear if she has struggled to thrive her whole life or just over more recent years.   In any case, I strongly encouraged her to seek psychiatric assessment and care For the time being refilled her klonopin and will increase her paxil to 50 mg a day in hopes of improving her anxiety Encouraged daily exercise to help manage her weight Suspect her daily HA may be due to chronic excedrin use- encouraged her to stop using this  See patient instructions for more details.     Meds ordered this encounter  Medications  . PARoxetine (PAXIL) 20 MG tablet    Sig: Take 2.5 pills (47m) daily    Dispense:  75 tablet    Refill:  3  . clonazePAM (KLONOPIN) 0.5 MG tablet    Sig: Take 1 tablet (0.5 mg total) by mouth 2 (two) times daily as needed. for anxiety    Dispense:  60 tablet    Refill:  3  . tretinoin (RETIN-A) 0.1 % cream    Sig: Apply topically at bedtime. Use less frequently if irritation    Dispense:  45 g    Refill:  2     Signed JLamar Blinks MD

## 2015-08-26 ENCOUNTER — Telehealth: Payer: Self-pay | Admitting: Family Medicine

## 2015-08-26 NOTE — Telephone Encounter (Signed)
fwding to Murtis Sink

## 2015-08-26 NOTE — Telephone Encounter (Signed)
Caller name: Irna, Maddaloni Relationship to patient: self Can be reached: [phone]  Reason for call: Pt states pharmacy sent form for PA on Retin-A. Gave pt fax # [phone] and asked her to have the pharmacy refax.

## 2015-08-27 NOTE — Telephone Encounter (Signed)
PA submitted to [employer]. Awaiting determination. JG//CMA

## 2015-11-25 ENCOUNTER — Ambulatory Visit: Payer: Medicaid Other | Admitting: Family Medicine

## 2015-12-05 ENCOUNTER — Other Ambulatory Visit: Payer: Self-pay | Admitting: Family Medicine

## 2015-12-05 DIAGNOSIS — F411 Generalized anxiety disorder: Secondary | ICD-10-CM

## 2015-12-08 ENCOUNTER — Other Ambulatory Visit: Payer: Self-pay | Admitting: Emergency Medicine

## 2015-12-08 DIAGNOSIS — F411 Generalized anxiety disorder: Secondary | ICD-10-CM

## 2015-12-08 MED ORDER — CLONAZEPAM 0.5 MG PO TABS: 0.5000 mg | ORAL_TABLET | Freq: Two times a day (BID) | ORAL | 0 refills | Status: DC | PRN

## 2015-12-08 NOTE — Telephone Encounter (Signed)
Received refill request for CLONAZEPAM 0.5MG  TABLETS. Last office visit and refill on 08/25/2015. Will it be ok to refill? Please advise.

## 2015-12-27 ENCOUNTER — Other Ambulatory Visit: Payer: Self-pay | Admitting: Family Medicine

## 2015-12-27 DIAGNOSIS — F411 Generalized anxiety disorder: Secondary | ICD-10-CM

## 2015-12-29 ENCOUNTER — Other Ambulatory Visit: Payer: Self-pay | Admitting: Family Medicine

## 2015-12-29 DIAGNOSIS — F411 Generalized anxiety disorder: Secondary | ICD-10-CM

## 2015-12-29 NOTE — Telephone Encounter (Signed)
Received refill request for CLONAZEPAM 0.5MG  TABLETS. Last office visit 08/25/2015 and last refill 12/08/15. Is it ok to refill? Please advise.

## 2015-12-31 ENCOUNTER — Other Ambulatory Visit: Payer: Self-pay | Admitting: Gastroenterology

## 2015-12-31 ENCOUNTER — Telehealth: Payer: Self-pay | Admitting: Family Medicine

## 2015-12-31 DIAGNOSIS — F411 Generalized anxiety disorder: Secondary | ICD-10-CM

## 2016-01-03 ENCOUNTER — Other Ambulatory Visit: Payer: Self-pay | Admitting: Emergency Medicine

## 2016-01-03 DIAGNOSIS — F411 Generalized anxiety disorder: Secondary | ICD-10-CM

## 2016-01-03 MED ORDER — PAROXETINE HCL 20 MG PO TABS: ORAL_TABLET | ORAL | 3 refills | Status: DC

## 2016-01-03 NOTE — Telephone Encounter (Signed)
Yes you can refill it. Can you please book her a follow up visit with me. Thanks

## 2016-01-03 NOTE — Telephone Encounter (Signed)
Can this pt have a refill of Omeprazole? She has not been seen since 10/2014.

## 2016-01-04 NOTE — Telephone Encounter (Signed)
Left message on home number informing pt that Dr Havery Moros refilled her requested med and that she needed to schedule a follow up appt with him.

## 2016-01-05 NOTE — Telephone Encounter (Signed)
Relation to PO:[id number] Call back number:[phone] Pharmacy: [facility], [place] [phone] (Phone) [phone] (Fax)     Reason for call:  Pharmacy requesting hard copy patient would like to pick up in office today due to provider being off site Thursday and Friday, please advise

## 2016-01-05 NOTE — Telephone Encounter (Signed)
Rx was phoned to pharmacy on 12/29/15. Called pharmacy to verify receipt, they had prescription stored because it was too soon to fill it until today, but they're going to fill it now. Called pt and notified her, no further needs at this time.

## 2016-01-30 ENCOUNTER — Other Ambulatory Visit: Payer: Self-pay | Admitting: Family Medicine

## 2016-01-30 DIAGNOSIS — F411 Generalized anxiety disorder: Secondary | ICD-10-CM

## 2016-01-31 ENCOUNTER — Other Ambulatory Visit: Payer: Self-pay | Admitting: Gastroenterology

## 2016-01-31 ENCOUNTER — Other Ambulatory Visit: Payer: Self-pay | Admitting: Emergency Medicine

## 2016-01-31 DIAGNOSIS — F411 Generalized anxiety disorder: Secondary | ICD-10-CM

## 2016-01-31 MED ORDER — CLONAZEPAM 0.5 MG PO TABS
0.5000 mg | ORAL_TABLET | Freq: Two times a day (BID) | ORAL | 1 refills | Status: DC | PRN
Start: 2016-01-31 — End: 2016-03-17

## 2016-01-31 NOTE — Telephone Encounter (Signed)
Received refill request for CLONAZEPAM 0.5MG  TABLETS. Last refill 12/29/15 and last office visit 08/25/15. Is it ok to refill? Please advise.

## 2016-03-17 ENCOUNTER — Other Ambulatory Visit: Payer: Self-pay | Admitting: Family Medicine

## 2016-03-17 ENCOUNTER — Other Ambulatory Visit: Payer: Self-pay | Admitting: Gastroenterology

## 2016-03-17 DIAGNOSIS — F411 Generalized anxiety disorder: Secondary | ICD-10-CM

## 2016-03-17 NOTE — Telephone Encounter (Signed)
Yes that's fine but I would like to see her back in clinic for reassessment if you can help coordinate a follow up. Thanks

## 2016-03-17 NOTE — Telephone Encounter (Signed)
Are you ok with refilling Omeprazole 20mg  BID? You performed an upper endoscopy 12/2014 but have not seen the pt in clinic.

## 2016-03-17 NOTE — Telephone Encounter (Signed)
Reviewed [place]- no unexpected entries, she last received a months supply of klonopin on 12/15. She is due for an OV- called and LMOM with pt that she needs to come in prior to further refills

## 2016-03-17 NOTE — Telephone Encounter (Signed)
Received med refill for clonazePAM (KLONOPIN) 0.5 MG tablet. Last visit 08/25/2015 and last refill 01/31/2016. Is it ok to refill? Please advise.

## 2016-03-20 NOTE — Telephone Encounter (Signed)
Caryl Pina can you help clarify if there are any issues with this prescription, not sure why this is getting sent back to me. Thanks

## 2016-03-20 NOTE — Telephone Encounter (Signed)
Tried calling pt x2. The phone would ring and then no sound at all.

## 2016-04-12 ENCOUNTER — Other Ambulatory Visit: Payer: Self-pay | Admitting: Gastroenterology

## 2016-04-12 ENCOUNTER — Other Ambulatory Visit: Payer: Self-pay | Admitting: Family Medicine

## 2016-04-12 DIAGNOSIS — F411 Generalized anxiety disorder: Secondary | ICD-10-CM

## 2016-04-13 NOTE — Telephone Encounter (Signed)
Yes you can refill it. #120 RF3. She can follow up with [place] if needed as well. Thanks

## 2016-04-13 NOTE — Telephone Encounter (Signed)
Are you ok to refill. This pts last visit was 09/2014.

## 2016-04-15 ENCOUNTER — Other Ambulatory Visit: Payer: Self-pay | Admitting: Family Medicine

## 2016-04-15 DIAGNOSIS — F411 Generalized anxiety disorder: Secondary | ICD-10-CM

## 2016-04-17 ENCOUNTER — Other Ambulatory Visit: Payer: Self-pay | Admitting: Emergency Medicine

## 2016-04-17 ENCOUNTER — Telehealth: Payer: Self-pay | Admitting: Family Medicine

## 2016-04-17 DIAGNOSIS — F411 Generalized anxiety disorder: Secondary | ICD-10-CM

## 2016-04-17 MED ORDER — CLONAZEPAM 0.5 MG PO TABS: 0.5000 mg | ORAL_TABLET | Freq: Two times a day (BID) | ORAL | 0 refills | Status: DC | PRN

## 2016-04-17 MED ORDER — PAROXETINE HCL 20 MG PO TABS: ORAL_TABLET | ORAL | 3 refills | Status: DC

## 2016-04-17 NOTE — Telephone Encounter (Signed)
Received refill request for clonazePAM (KLONOPIN) 0.5 MG tablet last filled 03/17/16 and PARoxetine (Paxil) 20 MG tablet last filled 01/03/16. Last office visit 08/25/15. Is it ok to refill? Please advise.

## 2016-04-17 NOTE — Telephone Encounter (Signed)
Patient would like to get clonazePAM (KLONOPIN) 0.5 MG tablet  Transferred from the pharmacy on file to a different pharmacy out of state. Patient states she is out of town and will be out of town for a while. Please advise  Pharmacy: [facility], [place], [place]   Phone: [phone]

## 2016-04-18 NOTE — Telephone Encounter (Signed)
Called local [facility]- they are not able to transfer rx.  Will have to be faxed to pharmacy in [place] tomorrow

## 2016-04-19 MED ORDER — OMEPRAZOLE 20 MG PO CPDR: DELAYED_RELEASE_CAPSULE | ORAL | 1 refills | Status: DC

## 2016-04-19 MED ORDER — CLONAZEPAM 0.5 MG PO TABS: 0.5000 mg | ORAL_TABLET | Freq: Two times a day (BID) | ORAL | 0 refills | Status: DC | PRN

## 2016-04-19 NOTE — Telephone Encounter (Signed)
° °  Patient requesting acid reflux medication and clonazePAM (KLONOPIN) 0.5 MG tablet please send to [facility], [place] [address] [phone]

## 2016-04-19 NOTE — Addendum Note (Signed)
Addended by: Lamar Blinks C on: 04/19/2016 08:01 PM   Modules accepted: Orders

## 2016-04-19 NOTE — Addendum Note (Signed)
Addended by: Lamar Blinks C on: 04/19/2016 09:52 AM   Modules accepted: Orders

## 2016-04-19 NOTE — Telephone Encounter (Signed)
Got fax number and faxed rx for her

## 2016-04-21 NOTE — Telephone Encounter (Signed)
Medications sent per documentation on med list.

## 2016-05-11 ENCOUNTER — Other Ambulatory Visit: Payer: Self-pay | Admitting: Family Medicine

## 2016-05-11 DIAGNOSIS — F411 Generalized anxiety disorder: Secondary | ICD-10-CM

## 2016-05-11 NOTE — Telephone Encounter (Signed)
Last seen here in June- she is overdue for follow-up [facility]: last fills of clonopin 1/12, 12/15, 11/20 No unexpected entries  Will refill one month but OV is required for more

## 2016-05-11 NOTE — Telephone Encounter (Signed)
Received refill request for clonazePAM (KLONOPIN) 0.5 MG tablet. Last office visit 08/25/15 and last refill 04/19/16. Is it ok to refill? Please advise.

## 2016-06-04 ENCOUNTER — Other Ambulatory Visit: Payer: Self-pay | Admitting: Family Medicine

## 2016-06-04 DIAGNOSIS — F411 Generalized anxiety disorder: Secondary | ICD-10-CM

## 2016-06-05 ENCOUNTER — Other Ambulatory Visit: Payer: Self-pay | Admitting: Emergency Medicine

## 2016-06-05 DIAGNOSIS — F411 Generalized anxiety disorder: Secondary | ICD-10-CM

## 2016-06-05 MED ORDER — CLONAZEPAM 0.5 MG PO TABS: ORAL_TABLET | ORAL | 0 refills | Status: DC

## 2016-06-05 NOTE — Telephone Encounter (Signed)
Called pt- she is back in [place].  Advised her that she will need an office visit within the next month. Refilled her klonopin for her today but she knows no further refills without a visit

## 2016-06-05 NOTE — Telephone Encounter (Signed)
Refill request for clonazePAM (KLONOPIN) 0.5 MG tablet.

## 2016-07-08 ENCOUNTER — Other Ambulatory Visit: Payer: Self-pay | Admitting: Family Medicine

## 2016-07-08 DIAGNOSIS — F411 Generalized anxiety disorder: Secondary | ICD-10-CM

## 2016-07-11 NOTE — Telephone Encounter (Signed)
Received refill request. Pt needs office visit in order to get refill. Tried to contact pt, no answer left voicemail.  Pt will need to have appointment scheduled to get additional refills.

## 2016-07-12 ENCOUNTER — Ambulatory Visit (INDEPENDENT_AMBULATORY_CARE_PROVIDER_SITE_OTHER): Payer: Medicaid Other | Admitting: Family Medicine

## 2016-07-12 VITALS — BP 116/82 | HR 92 | Temp 98.5°F | Ht 62.5 in | Wt 205.2 lb

## 2016-07-12 DIAGNOSIS — R062 Wheezing: Secondary | ICD-10-CM | POA: Diagnosis not present

## 2016-07-12 DIAGNOSIS — F331 Major depressive disorder, recurrent, moderate: Secondary | ICD-10-CM

## 2016-07-12 DIAGNOSIS — F411 Generalized anxiety disorder: Secondary | ICD-10-CM | POA: Diagnosis not present

## 2016-07-12 MED ORDER — CLONAZEPAM 0.5 MG PO TABS: ORAL_TABLET | ORAL | 3 refills | Status: DC

## 2016-07-12 MED ORDER — VORTIOXETINE HBR 10 MG PO TABS: 10.0000 mg | ORAL_TABLET | Freq: Every day | ORAL | 6 refills | Status: DC

## 2016-07-12 MED ORDER — ALBUTEROL SULFATE HFA 108 (90 BASE) MCG/ACT IN AERS: 2.0000 | INHALATION_SPRAY | Freq: Four times a day (QID) | RESPIRATORY_TRACT | 2 refills | Status: DC | PRN

## 2016-07-12 NOTE — Patient Instructions (Signed)
It was good to see you again today!  I am so sorry about the loss of your father  I would like to try changing you from Paxil to Trintellix for your depression and anxiety First taper the paxil- decrease to 40 mg for 10 days, then 20 mg for 10 days- then stop the paxil and start Trintellix.  This will give [place] time to work out any "prior authorization" issues as well  Continue using the klonopin as usual   Use the inhaler as needed for any wheezing You might try adding a daily claritin or zyrtec for the time being as well

## 2016-07-12 NOTE — Progress Notes (Signed)
[place] at [facility] [address] [place], [place] [address] [phone]  Date:  07/12/2016   Name:  Norma Goodman   DOB:  [birth year]   MRN:  [id number]  PCP:  Lamar Blinks, MD    Chief Complaint: Follow-up (Pt here for med f/u. Pt would like to discuss getting an inhaler for allergies pt states that she has used Ventolin in the past which helped. )   History of Present Illness:  Norma Goodman is a 31 y.o. very pleasant female patient who presents with the following:  Last seen here almost one year ago with anxiety.   She had been in [place] for a while staying with her SO's family but is now back in [place] She is taking paxil 60mg /d and also klonpin 0.5 BID Last rx for klonopin given on 06/05/16- could use a refill today  LMP 07/10/16  She feels like her anxiety and depression have been a little bit worse over the last few months as her father was diagnosed with cancer and then died in 2022-05-14.   He had metastatic bladder cancer and sadly died quickly after his dx  Her mom is "taking it hard." She is sleeping pretty well and her appetite has been quite good  Her daughter is doing well, she turned 5.    She increased to 60mg  of paxil about 3 months ago in hopes that it would help with her depression sx- However she is still feeling depressed and anxious.   She has also tried celexa in the past Would like to try a different medication for her depression No SI She is living with her SO which is good- she has more independence.  She has not gotten her driver's license as of yet however  [place]: fills of clonopin on 1/12, 3/1, and 3/26- nothing unexpected    Patient Active Problem List   Diagnosis Date Noted  . Rectal bleeding 10/19/2014  . Frequent headaches 10/19/2014  . Anxiety state, unspecified 04/24/2012    Past Medical History:  Diagnosis Date  . Anxiety   . Depression     No past surgical history on  file.  Social History  Substance Use Topics  . Smoking status: Heavy Tobacco Smoker    Packs/day: 1.00    Years: 9.00    Types: Cigarettes  . Smokeless tobacco: Never Used  . Alcohol use No    Family History  Problem Relation Age of Onset  . Diabetes Father   . Hypertension Father   . Heart disease Father     No Known Allergies  Medication list has been reviewed and updated.  Current Outpatient Prescriptions on File Prior to Visit  Medication Sig Dispense Refill  . aspirin-acetaminophen-caffeine (EXCEDRIN MIGRAINE) 250-250-65 MG per tablet Take 2 tablets by mouth every 6 (six) hours as needed for headache.     . clonazePAM (KLONOPIN) 0.5 MG tablet Take 1 twice daily as needed for anxiety. Office visit due- required for further refills 60 tablet 0  . PARoxetine (PAXIL) 20 MG tablet Take 2.5 pills (50mg ) daily 75 tablet 3  . tretinoin (RETIN-A) 0.1 % cream Apply topically at bedtime. Use less frequently if irritation 45 g 2   No current facility-administered medications on file prior to visit.     Review of Systems:  As per HPI- otherwise negative.   Physical Examination: Vitals:   07/12/16 1259  BP: 116/82  Pulse: 92  Temp: 98.5 F (36.9  C)   Vitals:   07/12/16 1259  Weight: 205 lb 3.2 oz (93.1 kg)  Height: 5' 2.5" (1.588 m)   Body mass index is 36.93 kg/m. Ideal Body Weight: Weight in (lb) to have BMI = 25: 138.6  GEN: WDWN, NAD, Non-toxic, A & O x 3, obese, otherwise looks well, accompanied by her BF today HEENT: Atraumatic, Normocephalic. Neck supple. No masses, No LAD. Ears and Nose: No external deformity. CV: RRR, No M/G/R. No JVD. No thrill. No extra heart sounds. PULM: CTA B, no wheezes, crackles, rhonchi. No retractions. No resp. distress. No accessory muscle use. EXTR: No c/c/e NEURO Normal gait.  PSYCH: Normally interactive. Conversant. Not depressed or anxious appearing.  Calm demeanor.  overweiht, otherwise looks well  Assessment and  Plan: Moderate episode of recurrent major depressive disorder ([place]) - Plan: vortioxetine HBr (TRINTELLIX) 10 MG TABS  Generalized anxiety disorder - Plan: vortioxetine HBr (TRINTELLIX) 10 MG TABS, clonazePAM (KLONOPIN) 0.5 MG tablet  Wheezing without diagnosis of asthma - Plan: albuterol (PROVENTIL HFA;VENTOLIN HFA) 108 (90 Base) MCG/ACT inhaler  Here today with persistent sx of anxiety and depression. She is on max paxil- this is not controlling her sx however and she is having trouble with her weight.  Would like to try changing her to Trintellix if we can get it paid for. Plan to taper her paxil over about 3 weeks and then make this change Refilled her albuterol which she uses as needed- also try adding claritin/ zyrtec during this heavy pollen time She will let me know how she is doing in about 6 weeks- Sooner if worse.   Refilled her klonopin   Meds ordered this encounter  Medications  . vortioxetine HBr (TRINTELLIX) 10 MG TABS    Sig: Take 1 tablet (10 mg total) by mouth daily.    Dispense:  30 tablet    Refill:  6  . albuterol (PROVENTIL HFA;VENTOLIN HFA) 108 (90 Base) MCG/ACT inhaler    Sig: Inhale 2 puffs into the lungs every 6 (six) hours as needed for wheezing or shortness of breath.    Dispense:  1 Inhaler    Refill:  2  . clonazePAM (KLONOPIN) 0.5 MG tablet    Sig: Take 1 twice daily as needed for anxiety.    Dispense:  60 tablet    Refill:  3     Signed Lamar Blinks, MD

## 2016-07-14 ENCOUNTER — Telehealth: Payer: Self-pay | Admitting: Emergency Medicine

## 2016-07-14 NOTE — Telephone Encounter (Signed)
PA submitted to [employer] for Trintellix 10 mg tablet. Awaiting determination. PA #[id number]. Turnaround time 24hrs. Can call [phone] to check status.

## 2016-07-21 ENCOUNTER — Telehealth: Payer: Self-pay | Admitting: Family Medicine

## 2016-07-21 NOTE — Telephone Encounter (Signed)
Routed to provider

## 2016-07-21 NOTE — Telephone Encounter (Signed)
Caller name: Relationship to patient: Self Can be reached: [phone]  Pharmacy:  Reason for call: Patient request call back to discuss medications.

## 2016-07-24 ENCOUNTER — Encounter: Payer: Self-pay | Admitting: Family Medicine

## 2016-07-26 MED ORDER — FLUOXETINE HCL 20 MG PO TABS: 20.0000 mg | ORAL_TABLET | Freq: Every day | ORAL | 3 refills | Status: DC

## 2016-07-26 NOTE — Addendum Note (Signed)
Addended by: Lamar Blinks C on: 07/26/2016 05:11 PM   Modules accepted: Orders

## 2016-08-17 ENCOUNTER — Encounter: Payer: Self-pay | Admitting: Family Medicine

## 2016-08-18 MED ORDER — FLUOXETINE HCL 40 MG PO CAPS: 40.0000 mg | ORAL_CAPSULE | Freq: Every day | ORAL | 6 refills | Status: DC

## 2016-09-06 ENCOUNTER — Encounter: Payer: Self-pay | Admitting: Family Medicine

## 2016-09-08 MED ORDER — FLUOXETINE HCL 20 MG PO TABS: 60.0000 mg | ORAL_TABLET | Freq: Every day | ORAL | 5 refills | Status: DC

## 2016-09-08 NOTE — Addendum Note (Signed)
Addended by: Lamar Blinks C on: 09/08/2016 08:59 AM   Modules accepted: Orders

## 2016-09-22 ENCOUNTER — Encounter: Payer: Self-pay | Admitting: Family Medicine

## 2016-09-22 DIAGNOSIS — F411 Generalized anxiety disorder: Secondary | ICD-10-CM

## 2016-09-23 MED ORDER — CLONAZEPAM 1 MG PO TABS: ORAL_TABLET | ORAL | 1 refills | Status: DC

## 2016-09-23 NOTE — Addendum Note (Signed)
Addended by: Lamar Blinks C on: 09/23/2016 03:22 PM   Modules accepted: Orders

## 2016-10-02 ENCOUNTER — Other Ambulatory Visit: Payer: Self-pay | Admitting: Family Medicine

## 2016-10-02 DIAGNOSIS — L700 Acne vulgaris: Secondary | ICD-10-CM

## 2016-10-13 ENCOUNTER — Encounter: Payer: Self-pay | Admitting: Family Medicine

## 2016-10-13 DIAGNOSIS — F411 Generalized anxiety disorder: Secondary | ICD-10-CM

## 2016-10-13 MED ORDER — CLONAZEPAM 1 MG PO TABS: ORAL_TABLET | ORAL | 1 refills | Status: DC

## 2016-10-16 ENCOUNTER — Encounter: Payer: Self-pay | Admitting: Family Medicine

## 2016-10-28 ENCOUNTER — Encounter: Payer: Self-pay | Admitting: Family Medicine

## 2016-10-28 ENCOUNTER — Other Ambulatory Visit: Payer: Self-pay | Admitting: Family Medicine

## 2016-10-28 DIAGNOSIS — R062 Wheezing: Secondary | ICD-10-CM

## 2016-11-21 ENCOUNTER — Encounter: Payer: Self-pay | Admitting: Family Medicine

## 2016-11-27 ENCOUNTER — Other Ambulatory Visit: Payer: Self-pay | Admitting: Family Medicine

## 2016-11-27 DIAGNOSIS — F411 Generalized anxiety disorder: Secondary | ICD-10-CM

## 2016-11-28 ENCOUNTER — Encounter: Payer: Self-pay | Admitting: Family Medicine

## 2016-11-29 ENCOUNTER — Other Ambulatory Visit: Payer: Self-pay | Admitting: Family Medicine

## 2016-11-29 ENCOUNTER — Other Ambulatory Visit: Payer: Self-pay | Admitting: Emergency Medicine

## 2016-11-29 DIAGNOSIS — F411 Generalized anxiety disorder: Secondary | ICD-10-CM

## 2016-11-29 MED ORDER — CLONAZEPAM 1 MG PO TABS: ORAL_TABLET | ORAL | 1 refills | Status: DC

## 2016-11-29 NOTE — Telephone Encounter (Signed)
Requesting: clonazePAM (KLONOPIN) 1 MG tablet Contract: UDS: Last OV: 07/12/16 Last Refill: 10/13/16  Please Advise

## 2016-12-26 ENCOUNTER — Encounter: Payer: Self-pay | Admitting: Family Medicine

## 2017-01-02 ENCOUNTER — Encounter: Payer: Self-pay | Admitting: Family Medicine

## 2017-01-04 ENCOUNTER — Ambulatory Visit (INDEPENDENT_AMBULATORY_CARE_PROVIDER_SITE_OTHER): Payer: Medicaid Other | Admitting: Family Medicine

## 2017-01-04 VITALS — BP 109/61 | HR 100 | Temp 98.3°F | Ht 62.5 in | Wt 198.4 lb

## 2017-01-04 DIAGNOSIS — F411 Generalized anxiety disorder: Secondary | ICD-10-CM | POA: Diagnosis not present

## 2017-01-04 DIAGNOSIS — F331 Major depressive disorder, recurrent, moderate: Secondary | ICD-10-CM | POA: Diagnosis not present

## 2017-01-04 NOTE — Patient Instructions (Addendum)
Please call the [facility] in [place] about mental health services;  Address: [facility], [place], [place] [address]  Phone: [phone]  I really think that counseling and/or seeing a psychiatrist would be helpful for you.    I am glad to do your pap for you at your convenience.   For the time being, you can take the klonopin three times a day if needed
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
## 2017-01-05 ENCOUNTER — Telehealth: Payer: Self-pay | Admitting: Family Medicine

## 2017-01-05 DIAGNOSIS — L700 Acne vulgaris: Secondary | ICD-10-CM

## 2017-01-05 MED ORDER — RETIN-A 0.1 % EX CREA: TOPICAL_CREAM | Freq: Every day | CUTANEOUS | 3 refills | Status: DC

## 2017-01-05 NOTE — Telephone Encounter (Signed)
Pt requesting refill on Retin-A 0.1 % cream. Please advise.

## 2017-01-28 ENCOUNTER — Other Ambulatory Visit: Payer: Self-pay | Admitting: Family Medicine

## 2017-01-28 DIAGNOSIS — R062 Wheezing: Secondary | ICD-10-CM

## 2017-01-31 ENCOUNTER — Other Ambulatory Visit: Payer: Self-pay | Admitting: Emergency Medicine

## 2017-01-31 DIAGNOSIS — R062 Wheezing: Secondary | ICD-10-CM

## 2017-01-31 MED ORDER — ALBUTEROL SULFATE HFA 108 (90 BASE) MCG/ACT IN AERS: 2.0000 | INHALATION_SPRAY | Freq: Four times a day (QID) | RESPIRATORY_TRACT | 1 refills | Status: DC | PRN

## 2017-02-10 ENCOUNTER — Other Ambulatory Visit: Payer: Self-pay | Admitting: Gastroenterology

## 2017-02-10 NOTE — Progress Notes (Signed)
[place] at [facility] [address], [place], [place] [address] [phone]  Date:  02/12/2017   Name:  Norma Goodman   DOB:  06/08/1985   MRN:  [id number]  PCP:  Darreld Mclean, MD    Chief Complaint: Ear Fullness (Onset about 5 days ago. Pt states her ears feel full and she's not sure why. Both ears have fullness with no pain)   History of Present Illness:  Norma Goodman is a 31 y.o. very pleasant female patient who presents with the following:  I last saw her in October of this year:  Here to discuss her anxiety and depression Right now Norma Goodman feels like her depression is under ok control, but her anxiety is still debilitating.  I would like to have her see psychiatry and also think she would benefit from a good bit of counseling and therapy.  [facility] take medicaid- she will contact them asap In the meantime advised her that she can take a 3rd mg of klonopin if needed.   She notes that both of her ears feel full and stuffy for the lat 5 days or so  Her ears may drain a bit of material, but they are not painful She notes that her hearing is decreased No recent swimming No sore throat, runny nose, etc Admits that she has been using qtips in her ears to try and clean them out  She notes that her mood is actually good!  She also needs a refill of her GERD medicationtoday  Patient Active Problem List   Diagnosis Date Noted  . Rectal bleeding 10/19/2014  . Frequent headaches 10/19/2014  . Anxiety state, unspecified 04/24/2012    Past Medical History:  Diagnosis Date  . Anxiety   . Depression     No past surgical history on file.  Social History   Tobacco Use  . Smoking status: Heavy Tobacco Smoker    Packs/day: 1.00    Years: 9.00    Pack years: 9.00    Types: Cigarettes  . Smokeless tobacco: Never Used  Substance Use Topics  . Alcohol use: No    Alcohol/week: 0.0 oz  . Drug use: No     Family History  Problem Relation Age of Onset  . Diabetes Father   . Hypertension Father   . Heart disease Father     No Known Allergies  Medication list has been reviewed and updated.  Current Outpatient Medications on File Prior to Visit  Medication Sig Dispense Refill  . albuterol (PROVENTIL HFA) 108 (90 Base) MCG/ACT inhaler Inhale 2 puffs into the lungs every 6 (six) hours as needed for wheezing or shortness of breath. 18 g 1  . aspirin-acetaminophen-caffeine (EXCEDRIN MIGRAINE) 295-284-13 MG per tablet Take 2 tablets by mouth every 6 (six) hours as needed for headache.     . clonazePAM (KLONOPIN) 1 MG tablet Take 1 am and pm for anxiety.  May take a 1/2 or whole pill at midday also if needed. 90 tablet 1  . FLUoxetine (PROZAC) 20 MG tablet Take 3 tablets (60 mg total) by mouth daily. 90 tablet 5  . omeprazole (PRILOSEC) 40 MG capsule Take 40 mg by mouth daily.    Marland Kitchen RETIN-A 0.1 % cream Apply topically at bedtime. 45 g 3   No current facility-administered medications on file prior to visit.     Review of Systems:  As per HPI- otherwise negative.  Physical Examination: Vitals:   02/12/17 1522  BP: (!) 110/55  Pulse: 98  Temp: 98 F (36.7 C)  SpO2: 98%   Vitals:   02/12/17 1522  Weight: 192 lb (87.1 kg)  Height: 5\' 3"  (1.6 m)   Body mass index is 34.01 kg/m. Ideal Body Weight: Weight in (lb) to have BMI = 25: 140.8  GEN: WDWN, NAD, Non-toxic, A & O x 3, overweight, cystic acne. Otherwise appears her normal self HEENT: Atraumatic, Normocephalic. Neck supple. No masses, No LAD.  Bilateral TM are occluded with ear wax today- not a large amount, but it appears to be pressed up against the TM, oropharynx normal.  PEERL,EOMI.   Ears and Nose: No external deformity. CV: RRR, No M/G/R. No JVD. No thrill. No extra heart sounds. PULM: CTA B, no wheezes, crackles, rhonchi. No retractions. No resp. distress. No accessory muscle use. ABD: S, NT, ND, +BS. No rebound. No  HSM. EXTR: No c/c/e NEURO Normal gait.  PSYCH: Normally interactive. Conversant. Not depressed or anxious appearing.  Calm demeanor.   Cerumen softened with colace and then removed with warm water irrigation.  Pt thinks that her ears feel better but they are still a bit waterlogged.  TM ok bilaterally   Assessment and Plan: Bilateral hearing loss due to cerumen impaction  Gastroesophageal reflux disease, esophagitis presence not specified  Removed cerumen as above.  She will let me know if ears do not continue to improve/ get back to normal Refilled her omeprazole today  Meds ordered this encounter  Medications  . omeprazole (PRILOSEC) 40 MG capsule    Sig: Take 1 capsule (40 mg total) by mouth daily. Use as needed for GERD    Dispense:  90 capsule    Refill:  3     Signed Lamar Blinks, MD

## 2017-02-12 ENCOUNTER — Ambulatory Visit (INDEPENDENT_AMBULATORY_CARE_PROVIDER_SITE_OTHER): Payer: Medicaid Other | Admitting: Family Medicine

## 2017-02-12 ENCOUNTER — Encounter: Payer: Self-pay | Admitting: Family Medicine

## 2017-02-12 VITALS — BP 110/55 | HR 98 | Temp 98.0°F | Ht 63.0 in | Wt 192.0 lb

## 2017-02-12 DIAGNOSIS — H6123 Impacted cerumen, bilateral: Secondary | ICD-10-CM

## 2017-02-12 DIAGNOSIS — K219 Gastro-esophageal reflux disease without esophagitis: Secondary | ICD-10-CM

## 2017-02-12 MED ORDER — OMEPRAZOLE 40 MG PO CPDR: 40.0000 mg | DELAYED_RELEASE_CAPSULE | Freq: Every day | ORAL | 3 refills | Status: DC

## 2017-02-12 NOTE — Telephone Encounter (Signed)
Dr. Havery Moros, this pt hasn't been seen since 2016 and doesn't have an appt scheduled. Are you Ok continuing to refill her omeprazole? Thanks.

## 2017-02-12 NOTE — Telephone Encounter (Signed)
Yes you can refill. #90 and she should see me in clinic for a follow up visit. Thanks

## 2017-02-12 NOTE — Telephone Encounter (Signed)
Called pt and left message to call back to schedule an appt.

## 2017-02-12 NOTE — Patient Instructions (Signed)
Good to see you today!  I refilled your acid reflux medication Please let me know if your ears are not feeling back to normal in the next day or two

## 2017-02-13 NOTE — Telephone Encounter (Signed)
LM for pt to call back to schedule appt and can refill Omep

## 2017-02-16 ENCOUNTER — Encounter (HOSPITAL_COMMUNITY): Payer: Self-pay

## 2017-02-16 ENCOUNTER — Emergency Department (HOSPITAL_COMMUNITY): Payer: Medicaid Other

## 2017-02-16 ENCOUNTER — Emergency Department (HOSPITAL_COMMUNITY)
Admission: EM | Admit: 2017-02-16 | Discharge: 2017-02-16 | Disposition: A | Discharge status: Home / Self Care | Payer: Medicaid Other | Attending: Emergency Medicine | Admitting: Emergency Medicine

## 2017-02-16 ENCOUNTER — Other Ambulatory Visit: Payer: Self-pay

## 2017-02-16 DIAGNOSIS — J189 Pneumonia, unspecified organism: Secondary | ICD-10-CM

## 2017-02-16 DIAGNOSIS — J181 Lobar pneumonia, unspecified organism: Secondary | ICD-10-CM | POA: Diagnosis not present

## 2017-02-16 DIAGNOSIS — F17210 Nicotine dependence, cigarettes, uncomplicated: Secondary | ICD-10-CM | POA: Diagnosis not present

## 2017-02-16 DIAGNOSIS — Z79899 Other long term (current) drug therapy: Secondary | ICD-10-CM | POA: Insufficient documentation

## 2017-02-16 DIAGNOSIS — R079 Chest pain, unspecified: Secondary | ICD-10-CM | POA: Diagnosis present

## 2017-02-16 LAB — URINALYSIS, ROUTINE W REFLEX MICROSCOPIC
Bilirubin Urine: NEGATIVE
GLUCOSE, UA: NEGATIVE mg/dL
HGB URINE DIPSTICK: NEGATIVE
Ketones, ur: NEGATIVE mg/dL
Leukocytes, UA: NEGATIVE
Nitrite: NEGATIVE
PH: 5 (ref 5.0–8.0)
PROTEIN: NEGATIVE mg/dL
SPECIFIC GRAVITY, URINE: 1.024 (ref 1.005–1.030)

## 2017-02-16 LAB — BASIC METABOLIC PANEL
Anion gap: 6 (ref 5–15)
BUN: 26 mg/dL — ABNORMAL HIGH (ref 6–20)
CHLORIDE: 106 mmol/L (ref 101–111)
CO2: 23 mmol/L (ref 22–32)
CREATININE: 0.75 mg/dL (ref 0.44–1.00)
Calcium: 8.8 mg/dL — ABNORMAL LOW (ref 8.9–10.3)
GFR calc non Af Amer: >60 mL/min (ref >60)
Glucose, Bld: 96 mg/dL (ref 65–99)
POTASSIUM: 4 mmol/L (ref 3.5–5.1)
SODIUM: 135 mmol/L (ref 135–145)

## 2017-02-16 LAB — CBC
HCT: 35.3 % — ABNORMAL LOW (ref 36.0–46.0)
Hemoglobin: 11.4 g/dL — ABNORMAL LOW (ref 12.0–15.0)
MCH: 27.8 pg (ref 26.0–34.0)
MCHC: 32.3 g/dL (ref 30.0–36.0)
MCV: 86.1 fL (ref 78.0–100.0)
PLATELETS: 564 10*3/uL — AB (ref 150–400)
RBC: 4.1 MIL/uL (ref 3.87–5.11)
RDW: 15.6 % — ABNORMAL HIGH (ref 11.5–15.5)
WBC: 13.5 10*3/uL — AB (ref 4.0–10.5)

## 2017-02-16 LAB — I-STAT BETA HCG BLOOD, ED (MC, WL, AP ONLY)

## 2017-02-16 LAB — I-STAT TROPONIN, ED
Troponin i, poc: 0 ng/mL (ref 0.00–0.08)
Troponin i, poc: 0 ng/mL (ref 0.00–0.08)

## 2017-02-16 LAB — D-DIMER, QUANTITATIVE (NOT AT ARMC): D DIMER QUANT: 0.74 ug{FEU}/mL — AB (ref 0.00–0.50)

## 2017-02-16 MED ORDER — IOPAMIDOL (ISOVUE-370) INJECTION 76%
100.0000 mL | Freq: Once | INTRAVENOUS | Status: AC | PRN
  Administered 2017-02-16: 100 mL via INTRAVENOUS

## 2017-02-16 MED ORDER — SODIUM CHLORIDE 0.9 % IV BOLUS (SEPSIS)
1000.0000 mL | Freq: Once | INTRAVENOUS | Status: AC
  Administered 2017-02-16: 1000 mL via INTRAVENOUS

## 2017-02-16 MED ORDER — IBUPROFEN 800 MG PO TABS
800.0000 mg | ORAL_TABLET | Freq: Once | ORAL | Status: AC
  Administered 2017-02-16: 800 mg via ORAL
  Filled 2017-02-16: qty 1

## 2017-02-16 MED ORDER — DOXYCYCLINE HYCLATE 100 MG PO CAPS: 100.0000 mg | ORAL_CAPSULE | Freq: Two times a day (BID) | ORAL | 0 refills | Status: DC

## 2017-02-16 NOTE — ED Triage Notes (Signed)
Pt reports that she woke up this morning with lower back pain. She states that shortly after, she started to experiencing centralized chest pressure and L arm tingling. Also endorses SOB, emesis x1, dizziness and lethargy. She denies diarrhea. A&Ox4. No distress or increased work of breathing in triage. Ambulatory.

## 2017-02-16 NOTE — ED Notes (Addendum)
Used portable EKG so EKG is not in system. Only on paper.

## 2017-02-16 NOTE — ED Provider Notes (Signed)
[facility] DEPT Provider Note   CSN: [id number] Arrival date & time: 02/16/17  1134     History   Chief Complaint Chief Complaint  Patient presents with  . Chest Pain  . Back Pain  . Dizziness    HPI Norma Goodman is a 31 y.o. female.  The history is provided by the patient. No language interpreter was used.   Norma Goodman is a 31 y.o. female who presents to the Emergency Department complaining of chest pain.  She was in her routine state of health until she woke up this morning and she had an episode of diffuse back pain with associated central chest pain described as a pressure.  She had shortness of breath at that time as well as slight headache.  She did have associated nausea and vomiting.  No fevers, cough, leg swelling or pain.  Overall her symptoms are significantly improved.  She does report some mild residual back discomfort as well as headache.  No current chest pain or shortness of breath.  No prior similar symptoms.  She does have a history of reflux and anxiety.  She has a family history of cardiac disease in her father in his late 79s.  She is a smoker.  She does not take any hormones.  No hemoptysis, prolonged travel, recent surgeries or significant injuries.  Past Medical History:  Diagnosis Date  . Anxiety   . Depression     Patient Active Problem List   Diagnosis Date Noted  . Rectal bleeding 10/19/2014  . Frequent headaches 10/19/2014  . Anxiety state, unspecified 04/24/2012    History reviewed. No pertinent surgical history.  OB History    No data available       Home Medications    Prior to Admission medications   Medication Sig Start Date End Date Taking? Authorizing Provider  albuterol (PROVENTIL HFA) 108 (90 Base) MCG/ACT inhaler Inhale 2 puffs into the lungs every 6 (six) hours as needed for wheezing or shortness of breath. 01/31/17  Yes Copland, Gay Filler, MD  aspirin-acetaminophen-caffeine (EXCEDRIN  MIGRAINE) [phone] MG per tablet Take 2 tablets by mouth every 6 (six) hours as needed for headache.    Yes [provider]  clonazePAM (KLONOPIN) 1 MG tablet Take 1 am and pm for anxiety.  May take a 1/2 or whole pill at midday also if needed. Patient taking differently: Take 1 mg by mouth daily. May take a 1/2 or whole pill at midday also if needed for anxiety 11/29/16  Yes Copland, Gay Filler, MD  FLUoxetine (PROZAC) 20 MG tablet Take 3 tablets (60 mg total) by mouth daily. 09/08/16  Yes Copland, Gay Filler, MD  omeprazole (PRILOSEC) 40 MG capsule Take 1 capsule (40 mg total) by mouth daily. Use as needed for GERD 02/12/17  Yes Copland, Gay Filler, MD  doxycycline (VIBRAMYCIN) 100 MG capsule Take 1 capsule (100 mg total) by mouth 2 (two) times daily. 02/16/17   Quintella Reichert, MD  FLUoxetine (PROZAC) 20 MG capsule TK 3 CS PO QD 02/11/17   [provider]  omeprazole (PRILOSEC) 20 MG capsule TAKE 1 CAPSULE(20 MG) BY MOUTH TWICE DAILY BEFORE A MEAL Patient not taking: Reported on 02/16/2017 02/14/17   Yetta Flock, MD  RETIN-A 0.1 % cream Apply topically at bedtime. Patient taking differently: Apply 1 application topically at bedtime as needed (acne).  01/05/17   Copland, Gay Filler, MD    Family History Family History  Problem Relation Age of Onset  .  Diabetes Father   . Hypertension Father   . Heart disease Father     Social History Social History   Tobacco Use  . Smoking status: Heavy Tobacco Smoker    Packs/day: 1.00    Years: 9.00    Pack years: 9.00    Types: Cigarettes  . Smokeless tobacco: Never Used  Substance Use Topics  . Alcohol use: No    Alcohol/week: 0.0 oz  . Drug use: No     Allergies   Patient has no known allergies.   Review of Systems Review of Systems  All other systems reviewed and are negative.    Physical Exam Updated Vital Signs BP 131/82 (BP Location: Right Arm)   Pulse 80   Temp 98.7 F (37.1 C) (Oral)   Resp 14    LMP 02/07/2017 (Exact Date)   SpO2 98%   Physical Exam  Constitutional: She is oriented to person, place, and time. She appears well-developed and well-nourished.  HENT:  Head: Normocephalic and atraumatic.  Cardiovascular: Normal rate and regular rhythm.  No murmur heard. Pulmonary/Chest: Effort normal and breath sounds normal. No respiratory distress.  Abdominal: Soft. There is no tenderness. There is no rebound and no guarding.  No CVA tenderness  Musculoskeletal: She exhibits no edema or tenderness.  Neurological: She is alert and oriented to person, place, and time.  5/5 strength in all four extremities.    Skin: Skin is warm and dry.  Psychiatric: She has a normal mood and affect. Her behavior is normal.  Nursing note and vitals reviewed.    ED Treatments / Results  Labs (all labs ordered are listed, but only abnormal results are displayed) Labs Reviewed  BASIC METABOLIC PANEL - Abnormal; Notable for the following components:      Result Value   BUN 26 (*)    Calcium 8.8 (*)    All other components within normal limits  CBC - Abnormal; Notable for the following components:   WBC 13.5 (*)    Hemoglobin 11.4 (*)    HCT 35.3 (*)    RDW 15.6 (*)    Platelets 564 (*)    All other components within normal limits  D-DIMER, QUANTITATIVE (NOT AT [facility]) - Abnormal; Notable for the following components:   D-Dimer, Quant 0.74 (*)    All other components within normal limits  URINALYSIS, ROUTINE W REFLEX MICROSCOPIC  I-STAT TROPONIN, ED  I-STAT BETA HCG BLOOD, ED ([facility], [facility], [facility] ONLY)  I-STAT TROPONIN, ED    EKG  EKG Interpretation  Date/Time:  Friday February 16 2017 17:17:43 EST Ventricular Rate:  82 PR Interval:    QRS Duration: 107 QT Interval:  383 QTC Calculation: 448 R Axis:   74 Text Interpretation:  Sinus rhythm Low voltage, precordial leads Confirmed by Quintella Reichert [id number]) on 02/16/2017 5:23:38 PM       Radiology Dg Chest 2 View  Result Date:  02/16/2017 CLINICAL DATA:  Chest pressure and left arm tingling. Shortness of breath and vomiting today. EXAM: CHEST  2 VIEW COMPARISON:  PA and lateral chest 09/09/2014. FINDINGS: Lungs are clear. Heart size is normal. No pneumothorax or pleural fluid. No bony abnormality. IMPRESSION: Negative chest. Electronically Signed   By: Inge Rise M.D.   On: 02/16/2017 14:27   Ct Angio Chest Pe W/cm &/or Wo Cm  Result Date: 02/16/2017 CLINICAL DATA:  Shortness of breath and chest pain. EXAM: CT ANGIOGRAPHY CHEST WITH CONTRAST TECHNIQUE: Multidetector CT imaging of the chest was performed using  the standard protocol during bolus administration of intravenous contrast. Multiplanar CT image reconstructions and MIPs were obtained to evaluate the vascular anatomy. CONTRAST:  119mL ISOVUE-370 IOPAMIDOL (ISOVUE-370) INJECTION 76% COMPARISON:  Chest radiograph February 16, 2017 FINDINGS: Cardiovascular: There is no demonstrable pulmonary embolus. There is no thoracic aortic aneurysm or dissection. The visualized great vessels appear unremarkable. There is no appreciable pericardial thickening or pericardial effusion. Mediastinum/Nodes: Thyroid appears normal. There is no appreciable thoracic adenopathy. No esophageal lesions are evident. Lungs/Pleura: There is mild bibasilar atelectatic change. There are several subtle foci of patchy opacity in the left upper lobe consistent with either atelectasis or earliest changes of patchy pneumonia in the left upper lobe. There is no well-defined ground-glass type opacity. No semi-solid nodular opacities are evident. No pleural effusion or pleural thickening evident. Upper Abdomen: In the visualized upper abdomen, there is incomplete visualization of a 1 mm calculus in the mid right kidney. Visualized upper abdominal structures otherwise appear unremarkable. Musculoskeletal: There is a hemangioma in the leftward aspect of the T8 vertebral body. No blastic or lytic bone lesions are  identified. Review of the MIP images confirms the above findings. IMPRESSION: 1.  No demonstrable pulmonary embolus. 2. Areas of either patchy atelectasis or earliest changes of pneumonia in left upper lobe. Mild bibasilar atelectasis present, likely dependent in etiology. 3.  No evident adenopathy. 4.  1 mm calculus right kidney Electronically Signed   By: Lowella Grip III M.D.   On: 02/16/2017 19:13    Procedures Procedures (including critical care time)  Medications Ordered in ED Medications  sodium chloride 0.9 % bolus 1,000 mL (0 mLs Intravenous Stopped 02/16/17 1949)  ibuprofen (ADVIL,MOTRIN) tablet 800 mg (800 mg Oral Given 02/16/17 1826)  iopamidol (ISOVUE-370) 76 % injection 100 mL (100 mLs Intravenous Contrast Given 02/16/17 1857)     Initial Impression / Assessment and Plan / ED Course  I have reviewed the triage vital signs and the nursing notes.  Pertinent labs & imaging results that were available during my care of the patient were reviewed by me and considered in my medical decision making (see chart for details).     Patient here for evaluation of back pain, chest pain.  She is in no acute distress in the emergency department.  Presentation is not consistent with ACS, dissection.  D-dimer was elevated and CTA obtained CT was concerning for developing pneumonia.  Will treat setting of her current symptoms.  Counseled patient on home care for chest pain/back pain.  Discussed treatment for pneumonia.  Discussed outpatient follow-up and return precautions.  Final Clinical Impressions(s) / ED Diagnoses   Final diagnoses:  Community acquired pneumonia of left upper lobe of lung [facility])    ED Discharge Orders        Ordered    doxycycline (VIBRAMYCIN) 100 MG capsule  2 times daily     02/16/17 1924       Quintella Reichert, MD 02/17/17 [phone]

## 2017-02-16 NOTE — ED Notes (Signed)
ED Provider at bedside. 

## 2017-02-16 NOTE — ED Notes (Signed)
Patient transported to CT 

## 2017-02-21 ENCOUNTER — Encounter: Payer: Self-pay | Admitting: Family Medicine

## 2017-03-22 ENCOUNTER — Other Ambulatory Visit: Payer: Self-pay | Admitting: Family Medicine

## 2017-04-09 ENCOUNTER — Other Ambulatory Visit: Payer: Self-pay | Admitting: Family Medicine

## 2017-04-09 DIAGNOSIS — F411 Generalized anxiety disorder: Secondary | ICD-10-CM

## 2017-04-09 NOTE — Telephone Encounter (Signed)
[facility] is not working currently.  Refill is appropriate per my records

## 2017-04-09 NOTE — Telephone Encounter (Signed)
Request for refill on clonazePAM (KLONOPIN) 1 MG tablet. Last office visit 02/12/17 and last refill 11/29/16. Please advise.

## 2017-04-11 ENCOUNTER — Ambulatory Visit: Payer: Self-pay | Admitting: *Deleted

## 2017-04-11 ENCOUNTER — Ambulatory Visit: Payer: Medicaid Other | Admitting: Family Medicine

## 2017-04-11 NOTE — Telephone Encounter (Signed)
  She was seen in the ED on Dec. 7th and diagnosed with pneumonia.  She completed the 7 day course of antibiotics and improved.   4 days ago she started experiencing shortness of breath, weakness, and pain in her upper back again.   These are the same symptoms she had when she went to the ED.  I made a same day appt with Dr. Janett Billow Copland for today at 12:30. Reason for Disposition . [1] Finished taking antibiotics AND [2] no improvement (e.g., "not feeling any better")  Answer Assessment - Initial Assessment Questions 1. SYMPTOM: "What's the main symptom you're concerned about?" (e.g., breathing difficulty, fever, weakness)     Shortness of breath, back pain in upper back, no fever, feeling weak.  Ears are bothering me.  They are white and flaky inside.  This has been going on about a month. 2. ONSET: "When did the  ________  start?"     4 days ago the shortness of breath started.  No asthma or breathing problem history. 3. BETTER-SAME-WORSE: "Are you getting better, staying the same, or getting worse compared to the day you were discharged?"     The symptoms are returning.  It's a dry cough 4. HOSPITALIZATION: "How long were you hospitalized?" (e.g., days)     Seen in the ED but not admitted.  Gave me IV fluids because I was dehydrated. 5. DISCHARGE DATE: "What date were you discharged from the hospital?"     Not admitted 6. DISCHARGE DOCTOR: "Who is the main doctor taking care of you now?"     Dr. Janett Billow Copland 7. DISCHARGE APPOINTMENT: "Have you scheduled a follow-up discharge appointment with your doctor?"     I need to follow up because my symptoms have returned. 8. DISCHARGE MEDICATIONS: "Did the physician who discharged you order any new medications for you to use? If yes, have you filled the prescription and started taking the medication?"      I don't remember the name. 9. DISCHARGE ANTIBIOTICS: "Are you taking any antibiotic medication now?"      I took all of them as directed.   I did get better but now the symptoms are coming back-the shortness of breath. 10. BREATHING DIFFICULTY: "Are you having any difficulty breathing?" If so, ask "How bad is it?"  (e.g., none, mild, moderate, severe)   - MILD: No SOB at rest, mild SOB with walking, speaks normally in sentences, can lay down, no retractions, pulse < 100.    - MODERATE: SOB at rest, SOB with minimal exertion and prefers to sit, cannot lie down flat, speaks in phrases, mild retractions, audible wheezing, pulse 100-120.    - SEVERE: Very SOB at rest, speaks in single words, struggling to breathe, sitting hunched forward, retractions, pulse > 120        Speaking in normal sentences but coughing. 11. FEVER: "Do you have a fever?" If so, ask: "What is it, how was it measured and when did it start?"       No fever 12. OTHER SYMPTOMS: "Do you have any other symptoms?" (e.g., weakness, confusion, pain)       No just in my lungs the shortness of breath and weakness.  Protocols used: PNEUMONIA ON ANTIBIOTIC POST-HOSPITALIZATION FOLLOW-UP CALL-A-AH

## 2017-04-11 NOTE — Progress Notes (Deleted)
[place] at [facility] [address], [place], [place] [address] [phone]  Date:  04/12/2017   Name:  Norma Goodman   DOB:  Jun 08, 1985   MRN:  [id number]  PCP:  Darreld Mclean, MD    Chief Complaint: No chief complaint on file.   History of Present Illness:  Norma Goodman is a 32 y.o. very pleasant female patient who presents with the following:  History of anxiety Here today with concern of illness She was in the ER in early December and was treated for CAP:  Patient here for evaluation of back pain, chest pain.  She is in no acute distress in the emergency department.  Presentation is not consistent with ACS, dissection.  D-dimer was elevated and CTA obtained CT was concerning for developing pneumonia.  Will treat setting of her current symptoms.  Counseled patient on home care for chest pain/back pain.  Discussed treatment for pneumonia.  Discussed outpatient follow-up and return precautions.  Final Clinical Impressions(s) / ED Diagnoses   Final diagnoses:  Community acquired pneumonia of left upper lobe of lung [facility])         ED Discharge Orders        Ordered    doxycycline (VIBRAMYCIN) 100 MG capsule  2 times daily        CSR from that visit read as negative  Patient Active Problem List   Diagnosis Date Noted  . Rectal bleeding 10/19/2014  . Frequent headaches 10/19/2014  . Anxiety state, unspecified 04/24/2012    Past Medical History:  Diagnosis Date  . Anxiety   . Depression     No past surgical history on file.  Social History   Tobacco Use  . Smoking status: Heavy Tobacco Smoker    Packs/day: 1.00    Years: 9.00    Pack years: 9.00    Types: Cigarettes  . Smokeless tobacco: Never Used  Substance Use Topics  . Alcohol use: No    Alcohol/week: 0.0 oz  . Drug use: No    Family History  Problem Relation Age of Onset  . Diabetes Father   . Hypertension Father   . Heart  disease Father     No Known Allergies  Medication list has been reviewed and updated.  Current Outpatient Medications on File Prior to Visit  Medication Sig Dispense Refill  . albuterol (PROVENTIL HFA) 108 (90 Base) MCG/ACT inhaler Inhale 2 puffs into the lungs every 6 (six) hours as needed for wheezing or shortness of breath. 18 g 1  . aspirin-acetaminophen-caffeine (EXCEDRIN MIGRAINE) 176-160-73 MG per tablet Take 2 tablets by mouth every 6 (six) hours as needed for headache.     . clonazePAM (KLONOPIN) 1 MG tablet TAKE 1 TABLET BY MOUTH EVERY MORNING, 1/2 TO 1 MIDDAY AS NEEDED, AND 1 EVERY EVENING 90 tablet 0  . doxycycline (VIBRAMYCIN) 100 MG capsule Take 1 capsule (100 mg total) by mouth 2 (two) times daily. 14 capsule 0  . FLUoxetine (PROZAC) 20 MG capsule TK 3 CS PO QD  5  . FLUoxetine (PROZAC) 20 MG capsule TAKE 3 CAPSULES BY MOUTH EVERY DAY 90 capsule 0  . omeprazole (PRILOSEC) 20 MG capsule TAKE 1 CAPSULE(20 MG) BY MOUTH TWICE DAILY BEFORE A MEAL (Patient not taking: Reported on 02/16/2017) 60 capsule 0  . omeprazole (PRILOSEC) 40 MG capsule Take 1 capsule (40 mg total) by mouth daily. Use as needed for GERD 90 capsule 3  .  RETIN-A 0.1 % cream Apply topically at bedtime. (Patient taking differently: Apply 1 application topically at bedtime as needed (acne). ) 45 g 3   No current facility-administered medications on file prior to visit.     Review of Systems:  As per HPI- otherwise negative.   Physical Examination: There were no vitals filed for this visit. There were no vitals filed for this visit. There is no height or weight on file to calculate BMI. Ideal Body Weight:    GEN: WDWN, NAD, Non-toxic, A & O x 3 HEENT: Atraumatic, Normocephalic. Neck supple. No masses, No LAD. Ears and Nose: No external deformity. CV: RRR, No M/G/R. No JVD. No thrill. No extra heart sounds. PULM: CTA B, no wheezes, crackles, rhonchi. No retractions. No resp. distress. No accessory muscle  use. ABD: S, NT, ND, +BS. No rebound. No HSM. EXTR: No c/c/e NEURO Normal gait.  PSYCH: Normally interactive. Conversant. Not depressed or anxious appearing.  Calm demeanor.    Assessment and Plan: ***  Signed Lamar Blinks, MD

## 2017-04-11 NOTE — Telephone Encounter (Signed)
FYI. Appt at 1230.

## 2017-04-12 ENCOUNTER — Ambulatory Visit: Payer: Medicaid Other | Admitting: Family Medicine

## 2017-04-22 ENCOUNTER — Other Ambulatory Visit: Payer: Self-pay | Admitting: Family Medicine

## 2017-05-02 ENCOUNTER — Encounter: Payer: Self-pay | Admitting: Family Medicine

## 2017-05-02 ENCOUNTER — Ambulatory Visit (INDEPENDENT_AMBULATORY_CARE_PROVIDER_SITE_OTHER): Payer: Medicaid Other | Admitting: Family Medicine

## 2017-05-02 ENCOUNTER — Ambulatory Visit (HOSPITAL_BASED_OUTPATIENT_CLINIC_OR_DEPARTMENT_OTHER)
Admission: RE | Admit: 2017-05-02 | Discharge: 2017-05-02 | Disposition: A | Discharge status: Home / Self Care | Payer: Medicaid Other | Source: Ambulatory Visit | Attending: Family Medicine | Admitting: Family Medicine

## 2017-05-02 VITALS — BP 122/82 | HR 85 | Temp 98.3°F | Ht 63.0 in | Wt 188.8 lb

## 2017-05-02 DIAGNOSIS — J40 Bronchitis, not specified as acute or chronic: Secondary | ICD-10-CM | POA: Diagnosis not present

## 2017-05-02 DIAGNOSIS — R05 Cough: Secondary | ICD-10-CM | POA: Insufficient documentation

## 2017-05-02 DIAGNOSIS — Z131 Encounter for screening for diabetes mellitus: Secondary | ICD-10-CM | POA: Diagnosis not present

## 2017-05-02 DIAGNOSIS — R059 Cough, unspecified: Secondary | ICD-10-CM

## 2017-05-02 DIAGNOSIS — L700 Acne vulgaris: Secondary | ICD-10-CM | POA: Diagnosis not present

## 2017-05-02 DIAGNOSIS — D75839 Thrombocytosis, unspecified: Secondary | ICD-10-CM

## 2017-05-02 DIAGNOSIS — H6063 Unspecified chronic otitis externa, bilateral: Secondary | ICD-10-CM | POA: Diagnosis not present

## 2017-05-02 DIAGNOSIS — R0789 Other chest pain: Secondary | ICD-10-CM

## 2017-05-02 DIAGNOSIS — D473 Essential (hemorrhagic) thrombocythemia: Secondary | ICD-10-CM | POA: Diagnosis not present

## 2017-05-02 DIAGNOSIS — D72829 Elevated white blood cell count, unspecified: Secondary | ICD-10-CM

## 2017-05-02 LAB — COMPREHENSIVE METABOLIC PANEL
ALK PHOS: 73 U/L (ref 39–117)
ALT: 19 U/L (ref 0–35)
AST: 15 U/L (ref 0–37)
Albumin: 4 g/dL (ref 3.5–5.2)
BILIRUBIN TOTAL: 0.1 mg/dL — AB (ref 0.2–1.2)
BUN: 29 mg/dL — AB (ref 6–23)
CO2: 27 meq/L (ref 19–32)
Calcium: 9.3 mg/dL (ref 8.4–10.5)
Chloride: 105 mEq/L (ref 96–112)
Creatinine, Ser: 0.92 mg/dL (ref 0.40–1.20)
GFR: 74.45 mL/min (ref >60.00)
GLUCOSE: 85 mg/dL (ref 70–99)
Potassium: 4.4 mEq/L (ref 3.5–5.1)
SODIUM: 139 meq/L (ref 135–145)
TOTAL PROTEIN: 7.1 g/dL (ref 6.0–8.3)

## 2017-05-02 LAB — CBC
HCT: 37.1 % (ref 36.0–46.0)
Hemoglobin: 11.7 g/dL — ABNORMAL LOW (ref 12.0–15.0)
MCHC: 31.6 g/dL (ref 30.0–36.0)
MCV: 84 fl (ref 78.0–100.0)
Platelets: 753 10*3/uL — ABNORMAL HIGH (ref 150.0–400.0)
RBC: 4.42 Mil/uL (ref 3.87–5.11)
RDW: 15.6 % — AB (ref 11.5–15.5)
WBC: 15 10*3/uL — AB (ref 4.0–10.5)

## 2017-05-02 LAB — TROPONIN I: TNIDX: 0 ug/l (ref 0.00–0.06)

## 2017-05-02 LAB — HEMOGLOBIN A1C: Hgb A1c MFr Bld: 6.3 % (ref 4.6–6.5)

## 2017-05-02 MED ORDER — ACETIC ACID 2 % OT SOLN: 4.0000 [drp] | Freq: Four times a day (QID) | OTIC | 0 refills | Status: DC

## 2017-05-02 MED ORDER — AZITHROMYCIN 250 MG PO TABS
ORAL_TABLET | ORAL | 0 refills | Status: DC
Start: 2017-05-02 — End: 2017-05-08

## 2017-05-02 NOTE — Addendum Note (Signed)
Addended by: Lamar Blinks C on: 05/02/2017 09:57 PM   Modules accepted: Orders
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
## 2017-05-02 NOTE — Patient Instructions (Addendum)
We will get some labs for you today- I will be in touch with these asap We will check for any sign of heart damage (troponin) today Assuming this is ok I will plan to have you see cardiology for further evaluation of your heart   We are going to treat you for likely bronchitis today with azithromycin  I also gave you some drops for your ears- vaseline may also be helpful for the very dry skin of your external ears  If you are getting worse or otherwise not doing ok please seek care right away I will refer you to dermatology to look at your skin

## 2017-05-03 ENCOUNTER — Other Ambulatory Visit: Payer: Self-pay

## 2017-05-03 NOTE — Telephone Encounter (Signed)
Called pt back. Discussed labs, pt verbalized understanding. Follow up lab appt scheduled for 05/10/17.    Copied from [place] [phone]. Topic: General - Other >> May 03, 2017 10:10 AM Carolyn Stare wrote:  Pt would like a call back concerning her labs     [phone]

## 2017-05-04 ENCOUNTER — Encounter: Payer: Self-pay | Admitting: Cardiology

## 2017-05-04 ENCOUNTER — Ambulatory Visit: Payer: Medicaid Other | Admitting: Cardiology

## 2017-05-04 VITALS — BP 128/72 | HR 93 | Ht 63.0 in | Wt 190.0 lb

## 2017-05-04 DIAGNOSIS — D72829 Elevated white blood cell count, unspecified: Secondary | ICD-10-CM

## 2017-05-04 DIAGNOSIS — F17210 Nicotine dependence, cigarettes, uncomplicated: Secondary | ICD-10-CM | POA: Diagnosis not present

## 2017-05-04 DIAGNOSIS — I209 Angina pectoris, unspecified: Secondary | ICD-10-CM

## 2017-05-04 DIAGNOSIS — Z01818 Encounter for other preprocedural examination: Secondary | ICD-10-CM | POA: Diagnosis not present

## 2017-05-04 MED ORDER — METOPROLOL SUCCINATE 25 MG PO CS24: 12.5000 | EXTENDED_RELEASE_CAPSULE | Freq: Two times a day (BID) | ORAL | 0 refills | Status: DC

## 2017-05-04 MED ORDER — ASPIRIN EC 81 MG PO TBEC: 81.0000 mg | DELAYED_RELEASE_TABLET | Freq: Every day | ORAL | 3 refills | Status: DC

## 2017-05-04 MED ORDER — ASPIRIN EC 325 MG PO TBEC: 325.0000 mg | DELAYED_RELEASE_TABLET | Freq: Every day | ORAL | 0 refills | Status: DC

## 2017-05-04 MED ORDER — NITROGLYCERIN 0.4 MG SL SUBL: 0.4000 mg | SUBLINGUAL_TABLET | SUBLINGUAL | 6 refills | Status: DC | PRN

## 2017-05-04 NOTE — Progress Notes (Signed)
Cardiology Office Note:    Date:  05/04/2017   ID:  Norma Goodman Goodman, Norma Goodman Goodman [birth year], MRN [id number]  PCP:  Norma Goodman Mclean, MD  Cardiologist:  Norma Goodman Lindau, MD   Referring MD: Norma Goodman Mclean, MD    ASSESSMENT:    1. Angina pectoris ([place])   2. Cigarette smoker    PLAN:    In order of problems listed above:  1. Primary prevention stressed with the patient.  Importance of compliance with diet and medications stressed and she vocalized understanding.  Diet was discussed for obesity and she plans to take better care of herself. 2. I spent 5 minutes with the patient discussing solely about smoking. Smoking cessation was counseled. I suggested to the patient also different medications and pharmacological interventions. Patient is keen to try stopping on its own at this time. He will get back to me if he needs any further assistance in this matter. 3. In view of the patient's symptoms, I discussed with the patient options for evaluation. Invasive and noninvasive options were given to the patient. I discussed stress testing and coronary angiography and left heart catheterization at length. Benefits, pros and cons of each approach were discussed at length. Patient had multiple questions which were answered to the patient's satisfaction. Patient opted for invasive evaluation and we will set up for coronary angiography and left heart catheterization. Further recommendations will be made based on the findings with coronary angiography. In the interim if the patient has any significant symptoms in hospital to the nearest emergency room. 4. Interim I started him on metoprolol 12.5 mg twice daily, enteric-coated aspirin 325 mg daily.  Sublingual nitroglycerin prescription was sent, its protocol and 911 protocol explained and the patient vocalized understanding questions were answered to the patient's satisfaction   Medication Adjustments/Labs and Tests Ordered: Current medicines are reviewed at  length with the patient today.  Concerns regarding medicines are outlined above.  Orders Placed This Encounter  Procedures  . INR/PT   Meds ordered this encounter  Medications  . DISCONTD: aspirin EC 81 MG tablet    Sig: Take 1 tablet (81 mg total) by mouth daily.    Dispense:  90 tablet    Refill:  3  . aspirin EC 325 MG tablet    Sig: Take 1 tablet (325 mg total) by mouth daily.    Dispense:  30 tablet    Refill:  0  . nitroGLYCERIN (NITROSTAT) 0.4 MG SL tablet    Sig: Place 1 tablet (0.4 mg total) under the tongue every 5 (five) minutes as needed.    Dispense:  11 tablet    Refill:  6     History of Present Illness:    Norma Goodman Goodman is a 32 y.o. female who is being seen today for the evaluation of chest pain at the request of Norma Goodman Goodman, Norma Filler, MD.  Patient mentions to me that she has been under significant stress.  She has stressful moments she has substernal chest tightness going to the neck into the arms.  No orthopnea or PND.  This does not occur when she is not stressed.  Recently she has had significant events in her family causing her stress.  At the time of my evaluation, the patient is alert awake oriented and in no distress.  Patient has been diagnosed to have prediabetes.  She is overweight leads a sedentary lifestyle and smoked more than a pack a day on a daily basis.  Her brother and his wife  accompanies her.  Past Medical History:  Diagnosis Date  . Anxiety   . Depression     History reviewed. No pertinent surgical history.  Current Medications: Current Meds  Medication Sig  . acetic acid 2 % otic solution Place 4 drops into both ears 4 (four) times daily.  Marland Kitchen albuterol (PROVENTIL HFA) 108 (90 Base) MCG/ACT inhaler Inhale 2 puffs into the lungs every 6 (six) hours as needed for wheezing or shortness of breath.  Marland Kitchen azithromycin (ZITHROMAX) 250 MG tablet Use as a zpack  . clonazePAM (KLONOPIN) 1 MG tablet TAKE 1 TABLET BY MOUTH EVERY MORNING, 1/2 TO 1 MIDDAY AS  NEEDED, AND 1 EVERY EVENING  . FLUoxetine (PROZAC) 20 MG capsule TAKE 3 CAPSULES BY MOUTH EVERY DAY  . FLUoxetine (PROZAC) 40 MG capsule   . omeprazole (PRILOSEC) 40 MG capsule Take 1 capsule (40 mg total) by mouth daily. Use as needed for GERD  . RETIN-A 0.1 % cream Apply topically at bedtime. (Patient taking differently: Apply 1 application topically at bedtime as needed (acne). )  . [DISCONTINUED] aspirin-acetaminophen-caffeine (EXCEDRIN MIGRAINE) 250-250-65 MG per tablet Take 2 tablets by mouth every 6 (six) hours as needed for headache.      Allergies:   Patient has no known allergies.   Social History   Socioeconomic History  . Marital status: Single    Spouse name: None  . Number of children: 1  . Years of education: None  . Highest education level: None  Social Needs  . Financial resource strain: None  . Food insecurity - worry: None  . Food insecurity - inability: None  . Transportation needs - medical: None  . Transportation needs - non-medical: None  Occupational History  . None  Tobacco Use  . Smoking status: Heavy Tobacco Smoker    Packs/day: 1.00    Years: 9.00    Pack years: 9.00    Types: Cigarettes  . Smokeless tobacco: Never Used  Substance and Sexual Activity  . Alcohol use: No    Alcohol/week: 0.0 oz  . Drug use: No  . Sexual activity: None  Other Topics Concern  . None  Social History Narrative  . None     Family History: The patient's family history includes Diabetes in her father; Heart disease in her father; Hypertension in her father.  ROS:   Please see the history of present illness.    All other systems reviewed and are negative.  EKGs/Labs/Other Studies Reviewed:    The following studies were reviewed today: I discussed my findings with the patient at extensive length.  EKG done revealed sinus rhythm and nonspecific ST-T changes.   Recent Labs: 05/02/2017: ALT 19; BUN 29; Creatinine, Ser 0.92; Hemoglobin 11.7; Platelets 753.0;  Potassium 4.4; Sodium 139  Recent Lipid Panel No results found for: CHOL, TRIG, HDL, CHOLHDL, VLDL, LDLCALC, LDLDIRECT  Physical Exam:    VS:  BP 128/72 (BP Location: Left Arm, Patient Position: Sitting, Cuff Size: Normal)   Pulse 93   Ht 5\' 3"  (1.6 m)   Wt 190 lb (86.2 kg)   LMP 04/24/2017   SpO2 98%   BMI 33.66 kg/m     Wt Readings from Last 3 Encounters:  05/04/17 190 lb (86.2 kg)  05/02/17 188 lb 12.8 oz (85.6 kg)  02/12/17 192 lb (87.1 kg)     GEN: Patient is in no acute distress HEENT: Normal NECK: No JVD; No carotid bruits LYMPHATICS: No lymphadenopathy CARDIAC: S1 S2 regular, 2/6 systolic murmur at  the apex. RESPIRATORY:  Clear to auscultation without rales, wheezing or rhonchi  ABDOMEN: Soft, non-tender, non-distended MUSCULOSKELETAL:  No edema; No deformity  SKIN: Warm and dry NEUROLOGIC:  Alert and oriented x 3 PSYCHIATRIC:  Normal affect    Signed, Norma Goodman Lindau, MD  05/04/2017 2:56 PM    [place]

## 2017-05-04 NOTE — Addendum Note (Signed)
Addended by: Mattie Marlin on: 05/04/2017 03:03 PM   Modules accepted: Orders

## 2017-05-04 NOTE — H&P (View-Only) (Signed)
Cardiology Office Note:    Date:  05/04/2017   ID:  Norma Goodman, Cardamone January 13, 1986, MRN [id number]  PCP:  Norma Mclean, MD  Cardiologist:  Norma Lindau, MD   Referring MD: Norma Mclean, MD    ASSESSMENT:    1. Angina pectoris ([place])   2. Cigarette smoker    PLAN:    In order of problems listed above:  1. Primary prevention stressed with the patient.  Importance of compliance with diet and medications stressed and she vocalized understanding.  Diet was discussed for obesity and she plans to take better care of herself. 2. I spent 5 minutes with the patient discussing solely about smoking. Smoking cessation was counseled. I suggested to the patient also different medications and pharmacological interventions. Patient is keen to try stopping on its own at this time. He will get back to me if he needs any further assistance in this matter. 3. In view of the patient's symptoms, I discussed with the patient options for evaluation. Invasive and noninvasive options were given to the patient. I discussed stress testing and coronary angiography and left heart catheterization at length. Benefits, pros and cons of each approach were discussed at length. Patient had multiple questions which were answered to the patient's satisfaction. Patient opted for invasive evaluation and we will set up for coronary angiography and left heart catheterization. Further recommendations will be made based on the findings with coronary angiography. In the interim if the patient has any significant symptoms in hospital to the nearest emergency room. 4. Interim I started him on metoprolol 12.5 mg twice daily, enteric-coated aspirin 325 mg daily.  Sublingual nitroglycerin prescription was sent, its protocol and 911 protocol explained and the patient vocalized understanding questions were answered to the patient's satisfaction   Medication Adjustments/Labs and Tests Ordered: Current medicines are reviewed at  length with the patient today.  Concerns regarding medicines are outlined above.  Orders Placed This Encounter  Procedures  . INR/PT   Meds ordered this encounter  Medications  . DISCONTD: aspirin EC 81 MG tablet    Sig: Take 1 tablet (81 mg total) by mouth daily.    Dispense:  90 tablet    Refill:  3  . aspirin EC 325 MG tablet    Sig: Take 1 tablet (325 mg total) by mouth daily.    Dispense:  30 tablet    Refill:  0  . nitroGLYCERIN (NITROSTAT) 0.4 MG SL tablet    Sig: Place 1 tablet (0.4 mg total) under the tongue every 5 (five) minutes as needed.    Dispense:  11 tablet    Refill:  6     History of Present Illness:    Norma Goodman is a 32 y.o. female who is being seen today for the evaluation of chest pain at the request of Norma Goodman, Norma Filler, MD.  Patient mentions to me that she has been under significant stress.  She has stressful moments she has substernal chest tightness going to the neck into the arms.  No orthopnea or PND.  This does not occur when she is not stressed.  Recently she has had significant events in her family causing her stress.  At the time of my evaluation, the patient is alert awake oriented and in no distress.  Patient has been diagnosed to have prediabetes.  She is overweight leads a sedentary lifestyle and smoked more than a pack a day on a daily basis.  Her brother and his wife  accompanies her.  Past Medical History:  Diagnosis Date  . Anxiety   . Depression     History reviewed. No pertinent surgical history.  Current Medications: Current Meds  Medication Sig  . acetic acid 2 % otic solution Place 4 drops into both ears 4 (four) times daily.  Marland Kitchen albuterol (PROVENTIL HFA) 108 (90 Base) MCG/ACT inhaler Inhale 2 puffs into the lungs every 6 (six) hours as needed for wheezing or shortness of breath.  Marland Kitchen azithromycin (ZITHROMAX) 250 MG tablet Use as a zpack  . clonazePAM (KLONOPIN) 1 MG tablet TAKE 1 TABLET BY MOUTH EVERY MORNING, 1/2 TO 1 MIDDAY AS  NEEDED, AND 1 EVERY EVENING  . FLUoxetine (PROZAC) 20 MG capsule TAKE 3 CAPSULES BY MOUTH EVERY DAY  . FLUoxetine (PROZAC) 40 MG capsule   . omeprazole (PRILOSEC) 40 MG capsule Take 1 capsule (40 mg total) by mouth daily. Use as needed for GERD  . RETIN-A 0.1 % cream Apply topically at bedtime. (Patient taking differently: Apply 1 application topically at bedtime as needed (acne). )  . [DISCONTINUED] aspirin-acetaminophen-caffeine (EXCEDRIN MIGRAINE) 250-250-65 MG per tablet Take 2 tablets by mouth every 6 (six) hours as needed for headache.      Allergies:   Patient has no known allergies.   Social History   Socioeconomic History  . Marital status: Single    Spouse name: None  . Number of children: 1  . Years of education: None  . Highest education level: None  Social Needs  . Financial resource strain: None  . Food insecurity - worry: None  . Food insecurity - inability: None  . Transportation needs - medical: None  . Transportation needs - non-medical: None  Occupational History  . None  Tobacco Use  . Smoking status: Heavy Tobacco Smoker    Packs/day: 1.00    Years: 9.00    Pack years: 9.00    Types: Cigarettes  . Smokeless tobacco: Never Used  Substance and Sexual Activity  . Alcohol use: No    Alcohol/week: 0.0 oz  . Drug use: No  . Sexual activity: None  Other Topics Concern  . None  Social History Narrative  . None     Family History: The patient's family history includes Diabetes in her father; Heart disease in her father; Hypertension in her father.  ROS:   Please see the history of present illness.    All other systems reviewed and are negative.  EKGs/Labs/Other Studies Reviewed:    The following studies were reviewed today: I discussed my findings with the patient at extensive length.  EKG done revealed sinus rhythm and nonspecific ST-T changes.   Recent Labs: 05/02/2017: ALT 19; BUN 29; Creatinine, Ser 0.92; Hemoglobin 11.7; Platelets 753.0;  Potassium 4.4; Sodium 139  Recent Lipid Panel No results found for: CHOL, TRIG, HDL, CHOLHDL, VLDL, LDLCALC, LDLDIRECT  Physical Exam:    VS:  BP 128/72 (BP Location: Left Arm, Patient Position: Sitting, Cuff Size: Normal)   Pulse 93   Ht 5\' 3"  (1.6 m)   Wt 190 lb (86.2 kg)   LMP 04/24/2017   SpO2 98%   BMI 33.66 kg/m     Wt Readings from Last 3 Encounters:  05/04/17 190 lb (86.2 kg)  05/02/17 188 lb 12.8 oz (85.6 kg)  02/12/17 192 lb (87.1 kg)     GEN: Patient is in no acute distress HEENT: Normal NECK: No JVD; No carotid bruits LYMPHATICS: No lymphadenopathy CARDIAC: S1 S2 regular, 2/6 systolic murmur at  the apex. RESPIRATORY:  Clear to auscultation without rales, wheezing or rhonchi  ABDOMEN: Soft, non-tender, non-distended MUSCULOSKELETAL:  No edema; No deformity  SKIN: Warm and dry NEUROLOGIC:  Alert and oriented x 3 PSYCHIATRIC:  Normal affect    Signed, Norma Lindau, MD  05/04/2017 2:56 PM    [place]

## 2017-05-04 NOTE — Addendum Note (Signed)
Addended by: Mattie Marlin on: 05/04/2017 03:15 PM   Modules accepted: Orders

## 2017-05-04 NOTE — Patient Instructions (Signed)
Medication Instructions:  Your physician has recommended you make the following change in your medication:   START enteric coated 325 mg aspirin daily START Nitroglycerin 0.4 mg sublingual (under your tongue) as needed for chest pain. If experiencing chest pain, stop what you are doing and sit down. Take 1 nitroglycerin and wait 5 minutes. If chest pain continues, take another nitroglycerin and wait 5 minutes. If chest pain does not subside, take 1 more nitroglycerin and dial 911. You make take a total of 3 nitroglycerin in a 15 minute time frame.  Labwork: Your physician recommends that you have the following labs drawn: INR  Testing/Procedures:   [place] [address] [place] [address] Dept: [phone] Loc: [phone]  Norma Goodman  05/04/2017  You are scheduled for a Cardiac Catheterization on Tuesday, February 26 with Dr. Peter Martinique.  1. Please arrive at the [facility] (Main Entrance A) at [facility]: [address] [place], [place] [address] at 11:30 AM (two hours before your procedure to ensure your preparation). Free valet parking service is available.   Special note: Every effort is made to have your procedure done on time. Please understand that emergencies sometimes delay scheduled procedures.  2. Diet: Do not eat or drink anything after midnight prior to your procedure except sips of water to take medications.  3. Labs: Done.  4. Medication instructions in preparation for your procedure:   On the morning of your procedure, take your Aspirin and any morning medicines NOT listed above.  You may use sips of water.  5. Plan for one night stay--bring personal belongings. 6. Bring a current list of your medications and current insurance cards. 7. You MUST have a responsible person to drive you home. 8. Someone MUST be with you the first 24 hours after you  arrive home or your discharge will be delayed. 9. Please wear clothes that are easy to get on and off and wear slip-on shoes.  Thank you for allowing [place] to care for you!   -- [facility]   Follow-Up: Your physician recommends that you schedule a follow-up appointment in: 1 month  Any Other Special Instructions Will Be Listed Below (If Applicable).     If you need a refill on your cardiac medications before your next appointment, please call your pharmacy.   [place], RN, BSN    Coronary Angiogram With Stent Coronary angiogram with stent placement is a procedure to widen or open a narrow blood vessel of the heart (coronary artery). Arteries may become blocked by cholesterol buildup (plaques) in the lining of the wall. When a coronary artery becomes partially blocked, blood flow to that area decreases. This may lead to chest pain or a heart attack (myocardial infarction). A stent is a small piece of metal that looks like mesh or a spring. Stent placement may be done as treatment for a heart attack or right after a coronary angiogram in which a blocked artery is found. Let your health care provider know about:  Any allergies you have.  All medicines you are taking, including vitamins, herbs, eye drops, creams, and over-the-counter medicines.  Any problems you or family members have had with anesthetic medicines.  Any blood disorders you have.  Any surgeries you have had.  Any medical conditions you have.  Whether you are pregnant or may be pregnant. What are the risks?  Generally, this is a safe procedure. However, problems may occur, including:  Damage to the heart or its blood vessels.  A return of blockage.  Bleeding, infection, or bruising at the insertion site.  A collection of blood under the skin (hematoma) at the insertion site.  A blood clot in another part of the body.  Kidney injury.  Allergic reaction  to the dye or contrast that is used.  Bleeding into the abdomen (retroperitoneal bleeding).  What happens before the procedure? Staying hydrated Follow instructions from your health care provider about hydration, which may include:  Up to 2 hours before the procedure - you may continue to drink clear liquids, such as water, clear fruit juice, black coffee, and plain tea.  Eating and drinking restrictions Follow instructions from your health care provider about eating and drinking, which may include:  8 hours before the procedure - stop eating heavy meals or foods such as meat, fried foods, or fatty foods.  6 hours before the procedure - stop eating light meals or foods, such as toast or cereal.  2 hours before the procedure - stop drinking clear liquids.  Ask your health care provider about:  Changing or stopping your regular medicines. This is especially important if you are taking diabetes medicines or blood thinners.  Taking medicines such as ibuprofen. These medicines can thin your blood. Do not take these medicines before your procedure if your health care provider instructs you not to. Generally, aspirin is recommended before a procedure of passing a small, thin tube (catheter) through a blood vessel and into the heart (cardiac catheterization).  What happens during the procedure?  An IV tube will be inserted into one of your veins.  You will be given one or more of the following: ? A medicine to help you relax (sedative). ? A medicine to numb the area where the catheter will be inserted into an artery (local anesthetic).  To reduce your risk of infection: ? Your health care team will wash or sanitize their hands. ? Your skin will be washed with soap. ? Hair may be removed from the area where the catheter will be inserted.  Using a guide wire, the catheter will be inserted into an artery. The location may be in your groin, in your wrist, or in the fold of your arm (near  your elbow).  A type of X-ray (fluoroscopy) will be used to help guide the catheter to the opening of the arteries in the heart.  A dye will be injected into the catheter, and X-rays will be taken. The dye will help to show where any narrowing or blockages are located in the arteries.  A tiny wire will be guided to the blocked spot, and a balloon will be inflated to make the artery wider.  The stent will be expanded and will crush the plaques into the wall of the vessel. The stent will hold the area open and improve the blood flow. Most stents have a drug coating to reduce the risk of the stent narrowing over time.  The artery may be made wider using a drill, laser, or other tools to remove plaques.  When the blood flow is better, the catheter will be removed. The lining of the artery will grow over the stent, which stays where it was placed. This procedure may vary among health care providers and hospitals. What happens after the procedure?  If the procedure is done through the leg, you will be kept in bed lying  flat for about 6 hours. You will be instructed to not bend and not cross your legs.  The insertion site will be checked frequently.  The pulse in your foot or wrist will be checked frequently.  You may have additional blood tests, X-rays, and a test that records the electrical activity of your heart (electrocardiogram, or ECG). This information is not intended to replace advice given to you by your health care provider. Make sure you discuss any questions you have with your health care provider. Document Released: 09/03/2002 Document Revised: 10/28/2015 Document Reviewed: 10/03/2015 Elsevier Interactive Patient Education  [employer].

## 2017-05-04 NOTE — Addendum Note (Signed)
Addended by: Jyl Heinz R on: 05/04/2017 03:01 PM   Modules accepted: Orders

## 2017-05-05 LAB — PROTIME-INR
INR: 1 (ref 0.8–1.2)
PROTHROMBIN TIME: 10.3 s (ref 9.1–12.0)

## 2017-05-05 LAB — PREGNANCY, URINE: Preg Test, Ur: NEGATIVE

## 2017-05-07 ENCOUNTER — Telehealth: Payer: Self-pay | Admitting: *Deleted

## 2017-05-07 NOTE — Telephone Encounter (Signed)
Spoke with patient, confirmed and  discussed cath instructions with patient, pt thanked me for the call.

## 2017-05-07 NOTE — Telephone Encounter (Signed)
Catheterization scheduled at [facility] for: Tuesday February 26,2019 1:30 PM Verify arrival time and place: [facility] Entrance A/[place] at: 11:30 AM Do not eat or drink after midnight the night before cath.  Verify AM meds can be  taken pre-cath with sip of water including: Aspirin AM of cath.  Confirm patient has responsible person to drive home post procedure and observe patient for 24 hours  I spoke with patient, she asked me to call her back around 12:30 PM to go over instructions with her.

## 2017-05-07 NOTE — Telephone Encounter (Signed)
LMTCB for pt to discuss  Instructions for cath

## 2017-05-08 ENCOUNTER — Ambulatory Visit (HOSPITAL_COMMUNITY)
Admission: RE | Disposition: A | Discharge status: Home / Self Care | Payer: Self-pay | Source: Ambulatory Visit | Attending: Cardiology

## 2017-05-08 ENCOUNTER — Ambulatory Visit (HOSPITAL_COMMUNITY)
Admission: RE | Admit: 2017-05-08 | Discharge: 2017-05-08 | Disposition: A | Discharge status: Home / Self Care | Payer: Medicaid Other | Source: Ambulatory Visit | Attending: Cardiology | Admitting: Cardiology

## 2017-05-08 DIAGNOSIS — R7303 Prediabetes: Secondary | ICD-10-CM | POA: Insufficient documentation

## 2017-05-08 DIAGNOSIS — Z8249 Family history of ischemic heart disease and other diseases of the circulatory system: Secondary | ICD-10-CM | POA: Diagnosis not present

## 2017-05-08 DIAGNOSIS — F17210 Nicotine dependence, cigarettes, uncomplicated: Secondary | ICD-10-CM | POA: Insufficient documentation

## 2017-05-08 DIAGNOSIS — E669 Obesity, unspecified: Secondary | ICD-10-CM | POA: Insufficient documentation

## 2017-05-08 DIAGNOSIS — R0789 Other chest pain: Secondary | ICD-10-CM | POA: Insufficient documentation

## 2017-05-08 DIAGNOSIS — F419 Anxiety disorder, unspecified: Secondary | ICD-10-CM | POA: Insufficient documentation

## 2017-05-08 DIAGNOSIS — R079 Chest pain, unspecified: Secondary | ICD-10-CM | POA: Diagnosis present

## 2017-05-08 DIAGNOSIS — I209 Angina pectoris, unspecified: Secondary | ICD-10-CM | POA: Diagnosis not present

## 2017-05-08 DIAGNOSIS — F329 Major depressive disorder, single episode, unspecified: Secondary | ICD-10-CM | POA: Insufficient documentation

## 2017-05-08 DIAGNOSIS — Z6833 Body mass index (BMI) 33.0-33.9, adult: Secondary | ICD-10-CM | POA: Diagnosis not present

## 2017-05-08 HISTORY — PX: LEFT HEART CATH AND CORONARY ANGIOGRAPHY: CATH118249

## 2017-05-08 LAB — CBC
HCT: 34.1 % — ABNORMAL LOW (ref 36.0–46.0)
Hemoglobin: 10.7 g/dL — ABNORMAL LOW (ref 12.0–15.0)
MCH: 26.5 pg (ref 26.0–34.0)
MCHC: 31.4 g/dL (ref 30.0–36.0)
MCV: 84.4 fL (ref 78.0–100.0)
PLATELETS: 560 10*3/uL — AB (ref 150–400)
RBC: 4.04 MIL/uL (ref 3.87–5.11)
RDW: 14.9 % (ref 11.5–15.5)
WBC: 10.4 10*3/uL (ref 4.0–10.5)

## 2017-05-08 LAB — HCG, SERUM, QUALITATIVE: Preg, Serum: NEGATIVE

## 2017-05-08 SURGERY — LEFT HEART CATH AND CORONARY ANGIOGRAPHY
Anesthesia: LOCAL

## 2017-05-08 MED ORDER — LIDOCAINE HCL (PF) 1 % IJ SOLN
INTRAMUSCULAR | Status: DC | PRN
  Administered 2017-05-08: 2 mL

## 2017-05-08 MED ORDER — SODIUM CHLORIDE 0.9 % WEIGHT BASED INFUSION: 1.0000 mL/kg/h | INTRAVENOUS | Status: DC

## 2017-05-08 MED ORDER — SODIUM CHLORIDE 0.9 % WEIGHT BASED INFUSION: 1.0000 mL/kg/h | INTRAVENOUS | Status: AC

## 2017-05-08 MED ORDER — VERAPAMIL HCL 2.5 MG/ML IV SOLN
INTRAVENOUS | Status: AC
  Filled 2017-05-08: qty 2

## 2017-05-08 MED ORDER — IOPAMIDOL (ISOVUE-370) INJECTION 76%
INTRAVENOUS | Status: DC | PRN
  Administered 2017-05-08: 60 mL via INTRAVENOUS

## 2017-05-08 MED ORDER — MIDAZOLAM HCL 2 MG/2ML IJ SOLN
INTRAMUSCULAR | Status: AC
  Filled 2017-05-08: qty 2

## 2017-05-08 MED ORDER — FENTANYL CITRATE (PF) 100 MCG/2ML IJ SOLN
INTRAMUSCULAR | Status: DC | PRN
  Administered 2017-05-08: 25 ug via INTRAVENOUS

## 2017-05-08 MED ORDER — MIDAZOLAM HCL 2 MG/2ML IJ SOLN
INTRAMUSCULAR | Status: DC | PRN
  Administered 2017-05-08: 2 mg via INTRAVENOUS

## 2017-05-08 MED ORDER — HEPARIN SODIUM (PORCINE) 1000 UNIT/ML IJ SOLN
INTRAMUSCULAR | Status: DC | PRN
  Administered 2017-05-08: 4000 [IU] via INTRAVENOUS

## 2017-05-08 MED ORDER — VERAPAMIL HCL 2.5 MG/ML IV SOLN
INTRAVENOUS | Status: DC | PRN
  Administered 2017-05-08: 10 mL via INTRA_ARTERIAL

## 2017-05-08 MED ORDER — IOPAMIDOL (ISOVUE-370) INJECTION 76%
INTRAVENOUS | Status: AC
  Filled 2017-05-08: qty 100

## 2017-05-08 MED ORDER — SODIUM CHLORIDE 0.9 % IV SOLN: 250.0000 mL | INTRAVENOUS | Status: DC | PRN

## 2017-05-08 MED ORDER — SODIUM CHLORIDE 0.9% FLUSH: 3.0000 mL | Freq: Two times a day (BID) | INTRAVENOUS | Status: DC

## 2017-05-08 MED ORDER — FENTANYL CITRATE (PF) 100 MCG/2ML IJ SOLN
INTRAMUSCULAR | Status: AC
  Filled 2017-05-08: qty 2

## 2017-05-08 MED ORDER — LIDOCAINE HCL 1 % IJ SOLN
INTRAMUSCULAR | Status: AC
  Filled 2017-05-08: qty 20

## 2017-05-08 MED ORDER — ASPIRIN 81 MG PO CHEW: 81.0000 mg | CHEWABLE_TABLET | ORAL | Status: DC

## 2017-05-08 MED ORDER — SODIUM CHLORIDE 0.9% FLUSH: 3.0000 mL | INTRAVENOUS | Status: DC | PRN

## 2017-05-08 MED ORDER — ACETAMINOPHEN 325 MG PO TABS
650.0000 mg | ORAL_TABLET | Freq: Once | ORAL | Status: AC
  Administered 2017-05-08: 650 mg via ORAL

## 2017-05-08 MED ORDER — HEPARIN (PORCINE) IN NACL 2-0.9 UNIT/ML-% IJ SOLN
INTRAMUSCULAR | Status: AC | PRN
  Administered 2017-05-08 (×2): 500 mL

## 2017-05-08 MED ORDER — SODIUM CHLORIDE 0.9 % WEIGHT BASED INFUSION
3.0000 mL/kg/h | INTRAVENOUS | Status: AC
  Administered 2017-05-08: 3 mL/kg/h via INTRAVENOUS

## 2017-05-08 MED ORDER — CLONAZEPAM 1 MG PO TABS
1.0000 mg | ORAL_TABLET | Freq: Once | ORAL | Status: AC
  Administered 2017-05-08: 1 mg via ORAL
  Filled 2017-05-08: qty 1

## 2017-05-08 MED ORDER — ACETAMINOPHEN 325 MG PO TABS
ORAL_TABLET | ORAL | Status: AC
  Filled 2017-05-08: qty 2

## 2017-05-08 MED ORDER — HEPARIN (PORCINE) IN NACL 2-0.9 UNIT/ML-% IJ SOLN
INTRAMUSCULAR | Status: AC
  Filled 2017-05-08: qty 500

## 2017-05-08 SURGICAL SUPPLY — 10 items
CATH 5FR JL3.5 JR4 ANG PIG MP (CATHETERS) ×2 IMPLANT
DEVICE RAD COMP TR BAND LRG (VASCULAR PRODUCTS) ×2 IMPLANT
GLIDESHEATH SLEND SS 6F .021 (SHEATH) ×2 IMPLANT
GUIDEWIRE INQWIRE 1.5J.035X260 (WIRE) ×1 IMPLANT
INQWIRE 1.5J .035X260CM (WIRE) ×2
KIT HEART LEFT (KITS) ×2 IMPLANT
PACK CARDIAC CATHETERIZATION (CUSTOM PROCEDURE TRAY) ×2 IMPLANT
SYR MEDRAD MARK V 150ML (SYRINGE) ×4 IMPLANT
TRANSDUCER W/STOPCOCK (MISCELLANEOUS) ×2 IMPLANT
TUBING CIL FLEX 10 FLL-RA (TUBING) ×2 IMPLANT

## 2017-05-08 NOTE — Discharge Instructions (Signed)

## 2017-05-08 NOTE — Progress Notes (Signed)
Client cont to c/o right radial pain and Dr Martinique notified and in to see client; no new orders noted and ok to d/c home

## 2017-05-08 NOTE — Interval H&P Note (Signed)
History and Physical Interval Note:  05/08/2017 2:09 PM  Crystalynn Soots  has presented today for surgery, with the diagnosis of angina  The various methods of treatment have been discussed with the patient and family. After consideration of risks, benefits and other options for treatment, the patient has consented to  Procedure(s): LEFT HEART CATH AND CORONARY ANGIOGRAPHY (N/A) as a surgical intervention .  The patient's history has been reviewed, patient examined, no change in status, stable for surgery.  I have reviewed the patient's chart and labs.  Questions were answered to the patient's satisfaction.   Cath Lab Visit (complete for each Cath Lab visit)  Clinical Evaluation Leading to the Procedure:   ACS: No.  Non-ACS:    Anginal Classification: CCS II  Anti-ischemic medical therapy: No Therapy  Non-Invasive Test Results: No non-invasive testing performed  Prior CABG: No previous CABG       Col[facility] 05/08/2017 2:10 PM

## 2017-05-08 NOTE — Progress Notes (Signed)
On arrival from cath lab client c/o 7/10 right radial pain and 7/10 headache and c/o anxiety; NP notified and orders noted

## 2017-05-09 ENCOUNTER — Encounter (HOSPITAL_COMMUNITY): Payer: Self-pay | Admitting: Cardiology

## 2017-05-09 ENCOUNTER — Telehealth: Payer: Self-pay | Admitting: Family Medicine

## 2017-05-09 ENCOUNTER — Encounter: Payer: Self-pay | Admitting: Family Medicine

## 2017-05-09 ENCOUNTER — Other Ambulatory Visit: Payer: Self-pay | Admitting: Family Medicine

## 2017-05-09 DIAGNOSIS — F411 Generalized anxiety disorder: Secondary | ICD-10-CM

## 2017-05-09 MED FILL — Lidocaine HCl Local Inj 1%: INTRAMUSCULAR | Qty: 20 | Status: AC

## 2017-05-09 MED FILL — Heparin Sodium (Porcine) 2 Unit/ML in Sodium Chloride 0.9%: INTRAMUSCULAR | Qty: 1000 | Status: AC

## 2017-05-09 NOTE — Telephone Encounter (Signed)
Called pt to confirm appt she stated she wanted to cancel lab appt because she went to heart dr and they did lab work on her. She asked if a nurse would call her back.

## 2017-05-09 NOTE — Telephone Encounter (Signed)
Pt is requesting refill on clonazepam.

## 2017-05-09 NOTE — Telephone Encounter (Signed)
Patient is calling back to check on the status of this RX being sent in. I informed her of the 72 hour time frame she said Dr. Lorelei Pont never takes that long and wants it refilled today.

## 2017-05-09 NOTE — Telephone Encounter (Signed)
Copied from [place]. Topic: Quick Communication - Rx Refill/Question >> May 09, 2017  1:46 PM Oliver Pila B wrote: Medication: clonazePAM (KLONOPIN) 1 MG tablet [[id number]]    Has the patient contacted their pharmacy? Yes.     (Agent: If no, request that the patient contact the pharmacy for the refill.)   Preferred Pharmacy (with phone number or street name): [facility]   Agent: Please be advised that RX refills may take up to 3 business days. We ask that you follow-up with your pharmacy.

## 2017-05-10 ENCOUNTER — Other Ambulatory Visit: Payer: Self-pay

## 2017-05-10 ENCOUNTER — Telehealth: Payer: Self-pay | Admitting: Cardiology

## 2017-05-10 ENCOUNTER — Other Ambulatory Visit: Payer: Medicaid Other

## 2017-05-10 MED ORDER — DIPHENHYDRAMINE HCL 25 MG PO TABS: 25.0000 mg | ORAL_TABLET | Freq: Two times a day (BID) | ORAL | 0 refills | Status: DC

## 2017-05-10 MED ORDER — PREDNISONE 10 MG (21) PO TBPK: ORAL_TABLET | ORAL | 0 refills | Status: DC

## 2017-05-10 MED ORDER — FAMOTIDINE 10 MG PO CHEW: 10.0000 mg | CHEWABLE_TABLET | Freq: Two times a day (BID) | ORAL | 0 refills | Status: DC

## 2017-05-10 NOTE — Telephone Encounter (Signed)
Patient called back stating that she did not want to go the urgent care as she was fearful to get the flu. Per Dr. Geraldo Pitter patietn was prescribed steroid, pepcid and benadryl.

## 2017-05-10 NOTE — Telephone Encounter (Signed)
Patient just had a cath on Tuesday and she is now having rash and itchy and having chills but no fever, she states that her body is feeling a burning sensation all over.. Please call patient back.Marland Kitchen

## 2017-05-10 NOTE — Telephone Encounter (Signed)
Per patient she has been experiencing chill (no fever), itching all over, upper and lower back pain since this morning. Per Dr. Geraldo Pitter patient should go to urgent care by [facility] hospital to seek further medical treatment. Patient was agreeable to this.

## 2017-05-22 ENCOUNTER — Other Ambulatory Visit: Payer: Self-pay | Admitting: Family Medicine

## 2017-05-31 ENCOUNTER — Other Ambulatory Visit: Payer: Self-pay | Admitting: Family Medicine

## 2017-05-31 DIAGNOSIS — F411 Generalized anxiety disorder: Secondary | ICD-10-CM

## 2017-06-01 ENCOUNTER — Encounter: Payer: Self-pay | Admitting: Cardiology

## 2017-06-01 ENCOUNTER — Ambulatory Visit (INDEPENDENT_AMBULATORY_CARE_PROVIDER_SITE_OTHER): Payer: Medicaid Other | Admitting: Cardiology

## 2017-06-01 VITALS — BP 124/80 | HR 87 | Ht 63.0 in | Wt 189.0 lb

## 2017-06-01 DIAGNOSIS — R079 Chest pain, unspecified: Secondary | ICD-10-CM | POA: Diagnosis not present

## 2017-06-01 DIAGNOSIS — F17210 Nicotine dependence, cigarettes, uncomplicated: Secondary | ICD-10-CM

## 2017-06-01 NOTE — Telephone Encounter (Signed)
Cannot sign into [facility] at the moment- will not let me in.  However per notes it is now ok to refill.  Will authorize for her

## 2017-06-01 NOTE — Telephone Encounter (Signed)
Requesting: clonazePAM (KLONOPIN) 1 MG tablet Contract  UDS Last OV: 05/02/17 Last Refill: 05/09/17  Please Advise

## 2017-06-01 NOTE — Progress Notes (Signed)
Cardiology Office Note:    Date:  06/01/2017   ID:  Norma Goodman, Moren 07/15/1985, MRN [id number]  PCP:  Darreld Mclean, MD  Cardiologist:  Jenean Lindau, MD   Referring MD: Darreld Mclean, MD    ASSESSMENT:    1. Cigarette smoker   2. Chest pain, unspecified type    PLAN:    In order of problems listed above:  1. Primary prevention stressed with the patient.  Importance of compliance with diet and medications stressed and she vocalized understanding.  Coronary angiography report was detailed at length.  I told her to begin an exercise program and lose weight that she will get better self-confidence with this.  She is willing to consider this.  Her lipids and other medical issues are followed by primary care physician.  She will be seen in follow-up appointment on a as needed basis.   Medication Adjustments/Labs and Tests Ordered: Current medicines are reviewed at length with the patient today.  Concerns regarding medicines are outlined above.  No orders of the defined types were placed in this encounter.  No orders of the defined types were placed in this encounter.    Chief Complaint  Patient presents with  . Follow-up  . Chest Pain     History of Present Illness:    Norma Goodman is a 32 y.o. female.  The patient was evaluated by me for chest pain.  Her symptoms were concerning and I discussed this with her and suggested coronary angiography and this fortunately has come out to be normal.  Now she denies any chest pain.  She tells me that she feels much better.  She leads a sedentary lifestyle and continues to smoke.  Past Medical History:  Diagnosis Date  . Anxiety   . Depression     Past Surgical History:  Procedure Laterality Date  . LEFT HEART CATH AND CORONARY ANGIOGRAPHY N/A 05/08/2017   Procedure: LEFT HEART CATH AND CORONARY ANGIOGRAPHY;  Surgeon: Martinique, Peter M, MD;  Location: [facility];  Service: Cardiovascular;  Laterality: N/A;      Current Medications: Current Meds  Medication Sig  . albuterol (PROVENTIL HFA) 108 (90 Base) MCG/ACT inhaler Inhale 2 puffs into the lungs every 6 (six) hours as needed for wheezing or shortness of breath.  . clonazePAM (KLONOPIN) 1 MG tablet TAKE 1 TABLET BY MOUTH EVERY MORNING AND TAKE 1/2 TO 1 TABLET MIDDAY AS NEEDED AND 1 TABLET BY MOUTH EVERY EVENING  . FLUoxetine (PROZAC) 20 MG capsule TAKE 3 CAPSULES BY MOUTH EVERY DAY  . Metoprolol Succinate 25 MG CS24 Take 12.5 tablets by mouth 2 (two) times daily.  Marland Kitchen omeprazole (PRILOSEC) 40 MG capsule Take 1 capsule (40 mg total) by mouth daily. Use as needed for GERD (Patient taking differently: Take 40 mg by mouth daily. )  . RETIN-A 0.1 % cream Apply topically at bedtime. (Patient taking differently: Apply 1 application topically at bedtime as needed (acne). )     Allergies:   Patient has no known allergies.   Social History   Socioeconomic History  . Marital status: Single    Spouse name: Not on file  . Number of children: 1  . Years of education: Not on file  . Highest education level: Not on file  Occupational History  . Not on file  Social Needs  . Financial resource strain: Not on file  . Food insecurity:    Worry: Not on file    Inability:  Not on file  . Transportation needs:    Medical: Not on file    Non-medical: Not on file  Tobacco Use  . Smoking status: Heavy Tobacco Smoker    Packs/day: 1.00    Years: 9.00    Pack years: 9.00    Types: Cigarettes  . Smokeless tobacco: Never Used  Substance and Sexual Activity  . Alcohol use: No    Alcohol/week: 0.0 oz  . Drug use: No  . Sexual activity: Not on file  Lifestyle  . Physical activity:    Days per week: Not on file    Minutes per session: Not on file  . Stress: Not on file  Relationships  . Social connections:    Talks on phone: Not on file    Gets together: Not on file    Attends religious service: Not on file    Active member of club or organization:  Not on file    Attends meetings of clubs or organizations: Not on file    Relationship status: Not on file  Other Topics Concern  . Not on file  Social History Narrative  . Not on file     Family History: The patient's family history includes Diabetes in her father; Heart disease in her father; Hypertension in her father.  ROS:   Please see the history of present illness.    All other systems reviewed and are negative.  EKGs/Labs/Other Studies Reviewed:    The following studies were reviewed today: I discussed the findings of the coronary angiography with the patient at extensive length.   Recent Labs: 05/02/2017: ALT 19; BUN 29; Creatinine, Ser 0.92; Potassium 4.4; Sodium 139 05/08/2017: Hemoglobin 10.7; Platelets 560  Recent Lipid Panel No results found for: CHOL, TRIG, HDL, CHOLHDL, VLDL, LDLCALC, LDLDIRECT  Physical Exam:    VS:  BP 124/80 (BP Location: Left Arm, Patient Position: Sitting, Cuff Size: Normal)   Pulse 87   Ht 5\' 3"  (1.6 m)   Wt 189 lb (85.7 kg)   SpO2 98%   BMI 33.48 kg/m     Wt Readings from Last 3 Encounters:  06/01/17 189 lb (85.7 kg)  05/08/17 180 lb (81.6 kg)  05/04/17 190 lb (86.2 kg)     GEN: Patient is in no acute distress HEENT: Normal NECK: No JVD; No carotid bruits LYMPHATICS: No lymphadenopathy CARDIAC: Hear sounds regular, 2/6 systolic murmur at the apex. RESPIRATORY:  Clear to auscultation without rales, wheezing or rhonchi  ABDOMEN: Soft, non-tender, non-distended MUSCULOSKELETAL:  No edema; No deformity  SKIN: Warm and dry NEUROLOGIC:  Alert and oriented x 3 PSYCHIATRIC:  Normal affect   Signed, Jenean Lindau, MD  06/01/2017 11:10 AM    [place]

## 2017-06-05 NOTE — Progress Notes (Addendum)
[place] at [facility] [address] [place], [place] [address] [phone]  Date:  06/06/2017   Name:  Norma Goodman   DOB:  02/26/1986   MRN:  [id number]  PCP:  Darreld Mclean, MD    Chief Complaint: Lab results (platelet count?)   History of Present Illness:  Norma Goodman is a 32 y.o. very pleasant female patient who presents with the following:  Following up today, has a female friend with her History of anxiety I saw her last month and she was sent to see cardiology for her CP Here today with cough for 2 weeks.  CXR does not show pneumonia.  Treat with azithromycin for bronchitis Chest pressure- this is her 2nd visit for same.  She does describe exertional chest pressure.  Certainly she is low risk due to her age and also long duration of sx, but will refer to cardiology for a consultation.  May need a stress. Will obtain troponin for the moment.   Referral to derm for her acne Will treat her ear discharge with vosol drops and vaseline for dryness in the external ears  She ended up getting a cath and was cleared.  She is concerned about her CBC, we will repeat for her today Noted to have thrombocytosis She also reports that her sister has some sort of hematological condition for which she takes daily medication- ?polycythemia vera. She will try to find out more details about this   She is using prilosec for her GERD- however she does not feel like this is helping her She continues to have sx even with daily prilosec She feels like she has noted acid reflux for 3 years or so   She did see GI , had an endoscopy performed - however this was about 3 years ago  Asked about NSAID use which was an issue then as well-  She is using BC powders at least once a day, sometimes twice, every day She uses these for her migraine HA She was dx with migraine per neurology in the past - however she is not sure what medication she  was advised to take, she did not really follow-up as her insurance lapsed so she could not continue treatment She notes that she does get aura and has light and sound sensitivity and nausea/ vomiting with her migraine at times.  She would like to try a preventive mediation such as Topamax   No fever or chills   BP Readings from Last 3 Encounters:  06/06/17 114/72  06/01/17 124/80  05/08/17 100/73   Wt Readings from Last 3 Encounters:  06/06/17 199 lb 12.8 oz (90.6 kg)  06/01/17 189 lb (85.7 kg)  05/08/17 180 lb (81.6 kg)    Patient Active Problem List   Diagnosis Date Noted  . Chest pain 06/01/2017  . Cigarette smoker 05/04/2017  . Rectal bleeding 10/19/2014  . Frequent headaches 10/19/2014  . Anxiety state, unspecified 04/24/2012    Past Medical History:  Diagnosis Date  . Anxiety   . Depression     Past Surgical History:  Procedure Laterality Date  . LEFT HEART CATH AND CORONARY ANGIOGRAPHY N/A 05/08/2017   Procedure: LEFT HEART CATH AND CORONARY ANGIOGRAPHY;  Surgeon: Martinique, Peter M, MD;  Location: [facility];  Service: Cardiovascular;  Laterality: N/A;    Social History   Tobacco Use  . Smoking status: Heavy Tobacco Smoker    Packs/day: 1.00  Years: 9.00    Pack years: 9.00    Types: Cigarettes  . Smokeless tobacco: Never Used  Substance Use Topics  . Alcohol use: No    Alcohol/week: 0.0 oz  . Drug use: No    Family History  Problem Relation Age of Onset  . Diabetes Father   . Hypertension Father   . Heart disease Father     No Known Allergies  Medication list has been reviewed and updated.  Current Outpatient Medications on File Prior to Visit  Medication Sig Dispense Refill  . albuterol (PROVENTIL HFA) 108 (90 Base) MCG/ACT inhaler Inhale 2 puffs into the lungs every 6 (six) hours as needed for wheezing or shortness of breath. 18 g 1  . clonazePAM (KLONOPIN) 1 MG tablet TAKE 1 TABLET BY MOUTH EVERY MORNING AND TAKE 1/2 TO 1 TABLET  MIDDAY AS NEEDED AND 1 TABLET BY MOUTH EVERY EVENING 90 tablet 1  . FLUoxetine (PROZAC) 20 MG capsule TAKE 3 CAPSULES BY MOUTH EVERY DAY 90 capsule 0  . Metoprolol Succinate 25 MG CS24 Take 12.5 tablets by mouth 2 (two) times daily. 30 capsule 0  . omeprazole (PRILOSEC) 40 MG capsule Take 1 capsule (40 mg total) by mouth daily. Use as needed for GERD (Patient taking differently: Take 40 mg by mouth daily. ) 90 capsule 3  . RETIN-A 0.1 % cream Apply topically at bedtime. (Patient taking differently: Apply 1 application topically at bedtime as needed (acne). ) 45 g 3   No current facility-administered medications on file prior to visit.     Review of Systems:  As per HPI- otherwise negative.   Physical Examination: Vitals:   06/06/17 1532  BP: 114/72  Pulse: 82  Resp: 16  SpO2: 99%   Vitals:   06/06/17 1532  Weight: 199 lb 12.8 oz (90.6 kg)  Height: 5\' 3"  (1.6 m)   Body mass index is 35.39 kg/m. Ideal Body Weight: Weight in (lb) to have BMI = 25: 140.8  GEN: WDWN, NAD, Non-toxic, A & O x 3, obese, otherwise looks well today HEENT: Atraumatic, Normocephalic. Neck supple. No masses, No LAD. Ears and Nose: No external deformity. CV: RRR, No M/G/R. No JVD. No thrill. No extra heart sounds. PULM: CTA B, no wheezes, crackles, rhonchi. No retractions. No resp. distress. No accessory muscle use. EXTR: No c/c/e NEURO Normal gait.  PSYCH: Normally interactive. Conversant. Not depressed or anxious appearing.  Calm demeanor.   Assessment and Plan: Thrombocytosis ([place]) - Plan: CBC with Differential/Platelet, Pathologist smear review, CANCELED: Pathologist smear review  Migraine with aura and without status migrainosus, not intractable - Plan: topiramate (TOPAMAX) 50 MG tablet  Will repeat her CBC and do a pathology smear today- Will plan further follow- up pending labs. She will try to find out more details about her sisters hematologic condition Will start her on topamax 25 at  bedtime for migraine, may increase to 50 mg after a week counseled her that [facility] powders are often very hard on the stomach lining and may be contributing to her gerd, encouraged her to try and stop these   Signed Lamar Blinks, MD  Received her labs 3/30-message to pt   Your white cell count is back up again. I might favor having you see hematology for a consultation.  Would this be ok with you? Did you find out what condition your sister has as of yet? Did the migraine medication seem to help?   I will go ahead and place a hematology  referral for you to look into this further Results for orders placed or performed in visit on 06/06/17  CBC with Differential/Platelet  Result Value Ref Range   WBC 14.2 (H) 4.0 - 10.5 K/uL   RBC 4.27 3.87 - 5.11 Mil/uL   Hemoglobin 11.2 (L) 12.0 - 15.0 g/dL   HCT 35.6 (L) 36.0 - 46.0 %   MCV 83.5 78.0 - 100.0 fl   MCHC 31.4 30.0 - 36.0 g/dL   RDW 15.9 (H) 11.5 - 15.5 %   Platelets 554.0 (H) 150.0 - 400.0 K/uL   Neutrophils Relative % 63.2 43.0 - 77.0 %   Lymphocytes Relative 27.3 12.0 - 46.0 %   Monocytes Relative 5.5 3.0 - 12.0 %   Eosinophils Relative 2.4 0.0 - 5.0 %   Basophils Relative 1.6 0.0 - 3.0 %   Neutro Abs 9.0 (H) 1.4 - 7.7 K/uL   Lymphs Abs 3.9 0.7 - 4.0 K/uL   Monocytes Absolute 0.8 0.1 - 1.0 K/uL   Eosinophils Absolute 0.3 0.0 - 0.7 K/uL   Basophils Absolute 0.2 (H) 0.0 - 0.1 K/uL  Pathologist smear review  Result Value Ref Range   Path Review     Absolute lymphocytosis and neutrophilia with mild left shift  in maturation. Favor reactive process for both. Recommend  clinical correlation.  The overall findings in this smear are consistent with  a reactive thrombocytosis. Clinical correlation is  recommended.  Anemia with RBCs which appear to be borderline microcytic  and hypochromic on smear review. Suggest evaluation for  iron deficiency, if clinically indicated.

## 2017-06-06 ENCOUNTER — Encounter: Payer: Self-pay | Admitting: Family Medicine

## 2017-06-06 ENCOUNTER — Ambulatory Visit (INDEPENDENT_AMBULATORY_CARE_PROVIDER_SITE_OTHER): Payer: Medicaid Other | Admitting: Family Medicine

## 2017-06-06 VITALS — BP 114/72 | HR 82 | Resp 16 | Ht 63.0 in | Wt 199.8 lb

## 2017-06-06 DIAGNOSIS — K219 Gastro-esophageal reflux disease without esophagitis: Secondary | ICD-10-CM

## 2017-06-06 DIAGNOSIS — D75839 Thrombocytosis, unspecified: Secondary | ICD-10-CM

## 2017-06-06 DIAGNOSIS — D473 Essential (hemorrhagic) thrombocythemia: Secondary | ICD-10-CM | POA: Diagnosis not present

## 2017-06-06 DIAGNOSIS — G43109 Migraine with aura, not intractable, without status migrainosus: Secondary | ICD-10-CM

## 2017-06-06 DIAGNOSIS — D72829 Elevated white blood cell count, unspecified: Secondary | ICD-10-CM | POA: Diagnosis not present

## 2017-06-06 MED ORDER — TOPIRAMATE 50 MG PO TABS: ORAL_TABLET | ORAL | 6 refills | Status: DC

## 2017-06-06 NOTE — Patient Instructions (Signed)
We will repeat your blood work today and be in touch with your asap I think your [facility] powder use is contributing to your stomach pains!  Please stop using these, and we will try topamax for your headaches instead.  Start with a 1/2 tablet at bedtime and increase to a whole tablet after one week.  Please let me know how this works for you

## 2017-06-07 LAB — CBC WITH DIFFERENTIAL/PLATELET
Basophils Absolute: 0.2 10*3/uL — ABNORMAL HIGH (ref 0.0–0.1)
Basophils Relative: 1.6 % (ref 0.0–3.0)
EOS PCT: 2.4 % (ref 0.0–5.0)
Eosinophils Absolute: 0.3 10*3/uL (ref 0.0–0.7)
HCT: 35.6 % — ABNORMAL LOW (ref 36.0–46.0)
HEMOGLOBIN: 11.2 g/dL — AB (ref 12.0–15.0)
LYMPHS ABS: 3.9 10*3/uL (ref 0.7–4.0)
Lymphocytes Relative: 27.3 % (ref 12.0–46.0)
MCHC: 31.4 g/dL (ref 30.0–36.0)
MCV: 83.5 fl (ref 78.0–100.0)
MONO ABS: 0.8 10*3/uL (ref 0.1–1.0)
Monocytes Relative: 5.5 % (ref 3.0–12.0)
NEUTROS PCT: 63.2 % (ref 43.0–77.0)
Neutro Abs: 9 10*3/uL — ABNORMAL HIGH (ref 1.4–7.7)
Platelets: 554 10*3/uL — ABNORMAL HIGH (ref 150.0–400.0)
RBC: 4.27 Mil/uL (ref 3.87–5.11)
RDW: 15.9 % — ABNORMAL HIGH (ref 11.5–15.5)
WBC: 14.2 10*3/uL — AB (ref 4.0–10.5)

## 2017-06-07 LAB — PATHOLOGIST SMEAR REVIEW

## 2017-06-09 ENCOUNTER — Encounter: Payer: Self-pay | Admitting: Family Medicine

## 2017-06-09 NOTE — Addendum Note (Signed)
Addended by: Lamar Blinks C on: 06/09/2017 05:20 PM   Modules accepted: Orders

## 2017-06-18 ENCOUNTER — Other Ambulatory Visit: Payer: Self-pay | Admitting: Family Medicine

## 2017-07-03 ENCOUNTER — Other Ambulatory Visit: Payer: Self-pay | Admitting: Family

## 2017-07-03 DIAGNOSIS — D72829 Elevated white blood cell count, unspecified: Secondary | ICD-10-CM

## 2017-07-03 DIAGNOSIS — D473 Essential (hemorrhagic) thrombocythemia: Secondary | ICD-10-CM

## 2017-07-03 DIAGNOSIS — D75839 Thrombocytosis, unspecified: Secondary | ICD-10-CM

## 2017-07-04 ENCOUNTER — Inpatient Hospital Stay: Payer: Medicaid Other | Attending: Family | Admitting: Family

## 2017-07-04 ENCOUNTER — Inpatient Hospital Stay: Payer: Medicaid Other

## 2017-07-04 VITALS — BP 125/69 | HR 89 | Resp 17 | Wt 185.2 lb

## 2017-07-04 DIAGNOSIS — D75839 Thrombocytosis, unspecified: Secondary | ICD-10-CM

## 2017-07-04 DIAGNOSIS — Z808 Family history of malignant neoplasm of other organs or systems: Secondary | ICD-10-CM

## 2017-07-04 DIAGNOSIS — E611 Iron deficiency: Secondary | ICD-10-CM | POA: Insufficient documentation

## 2017-07-04 DIAGNOSIS — D561 Beta thalassemia: Secondary | ICD-10-CM

## 2017-07-04 DIAGNOSIS — D72829 Elevated white blood cell count, unspecified: Secondary | ICD-10-CM | POA: Diagnosis not present

## 2017-07-04 DIAGNOSIS — D473 Essential (hemorrhagic) thrombocythemia: Secondary | ICD-10-CM

## 2017-07-04 DIAGNOSIS — K922 Gastrointestinal hemorrhage, unspecified: Secondary | ICD-10-CM | POA: Insufficient documentation

## 2017-07-04 DIAGNOSIS — Z720 Tobacco use: Secondary | ICD-10-CM | POA: Insufficient documentation

## 2017-07-04 DIAGNOSIS — D500 Iron deficiency anemia secondary to blood loss (chronic): Secondary | ICD-10-CM

## 2017-07-04 LAB — CMP (CANCER CENTER ONLY)
ALBUMIN: 3.5 g/dL (ref 3.5–5.0)
ALK PHOS: 69 U/L (ref 26–84)
ALT: 39 U/L (ref 10–47)
AST: 22 U/L (ref 11–38)
Anion gap: 7 (ref 5–15)
BUN: 23 mg/dL — AB (ref 7–22)
CALCIUM: 9.6 mg/dL (ref 8.0–10.3)
CHLORIDE: 114 mmol/L — AB (ref 98–108)
CO2: 24 mmol/L (ref 18–33)
CREATININE: 0.9 mg/dL (ref 0.60–1.20)
Glucose, Bld: 104 mg/dL (ref 73–118)
Potassium: 3.8 mmol/L (ref 3.3–4.7)
SODIUM: 145 mmol/L (ref 128–145)
Total Bilirubin: 0.4 mg/dL (ref 0.2–1.6)
Total Protein: 7.2 g/dL (ref 6.4–8.1)

## 2017-07-04 LAB — CBC WITH DIFFERENTIAL (CANCER CENTER ONLY)
BASOS ABS: 0.1 10*3/uL (ref 0.0–0.1)
BASOS PCT: 1 %
EOS ABS: 0.2 10*3/uL (ref 0.0–0.5)
Eosinophils Relative: 2 %
HCT: 33 % — ABNORMAL LOW (ref 34.8–46.6)
Hemoglobin: 10.6 g/dL — ABNORMAL LOW (ref 11.6–15.9)
Lymphocytes Relative: 27 %
Lymphs Abs: 2.7 10*3/uL (ref 0.9–3.3)
MCH: 26.6 pg (ref 26.0–34.0)
MCHC: 32.1 g/dL (ref 32.0–36.0)
MCV: 82.9 fL (ref 81.0–101.0)
MONOS PCT: 6 %
Monocytes Absolute: 0.6 10*3/uL (ref 0.1–0.9)
NEUTROS ABS: 6.5 10*3/uL (ref 1.5–6.5)
Neutrophils Relative %: 64 %
Platelet Count: 576 10*3/uL — ABNORMAL HIGH (ref 145–400)
RBC: 3.98 MIL/uL (ref 3.70–5.32)
RDW: 15.5 % (ref 11.1–15.7)
WBC: 10.1 10*3/uL — AB (ref 3.9–10.0)

## 2017-07-04 LAB — SAVE SMEAR

## 2017-07-04 NOTE — Progress Notes (Signed)
Hematology/Oncology Consultation   Name: Norma Goodman      MRN: [id number]    Location: Room/bed info not found  Date: 07/04/2017 Time:3:34 PM   REFERRING PHYSICIAN: Lamar Blinks, MD  REASON FOR CONSULT: Thrombocytosis and leukocytosis    DIAGNOSIS:  Iron deficiency anemia   HISTORY OF PRESENT ILLNESS: Norma Goodman is a very pleasant 32 yo female of [place] heritage. 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 Hgb is 10.6, MCV 82, WBC count 10.1 and platelet count 576.  She is symptomatic with fatigue, weakness, increased joint aches and pains, chewing ice, lightheadedness and decreased appetite.  Her cycle is regular but quite heavy with clots.  She takes goody powders off and on for headaches. She has history of migraines and recently started taking Topamax.  She has history of GI blood loss and has been noticing blood in her stool for 2 years. She was supposed to have had a colonoscopy 2 years ago. She states that she got sick and passed out while drinking the prep and had to cancel and never followed up.  Her endoscopy with Dr. Havery Moros in 2016 showed errosive gastritis in the antrum, suspect due to NSAIDs. H. pylori negative.  No surgical history in the past.  She states that her sister is JAK-2 positive. No family history of anemia that she knows of.  She was worked up by cardiology for chest pain, palpitations and SOB. Her heart cath was negative and EF 55-65%. She states that she has anxiety which may contribute to her symptoms.  No personal cancer history. Father had bladder cancer.  She has one daughter that is 67 yo. No history of miscarriage. She is a stay at home mom.  She is borderline diabetic but not on medication at this time.  No fever, chills, n/v, cough, rash, abdominal pain or changes in bowel or bladder habits.  No numbness or tingling in her extremities. She has puffiness in her feet that comes and goes. No c/o pain  at this time.  She is staying well hydrated.  She smokes 1/2-1 ppd. She does not drink alcohol.   ROS: All other 10 point review of systems is negative.   PAST MEDICAL HISTORY:   Past Medical History:  Diagnosis Date  . Anxiety   . Depression     ALLERGIES: No Known Allergies    MEDICATIONS:  Current Outpatient Medications on File Prior to Visit  Medication Sig Dispense Refill  . albuterol (PROVENTIL HFA) 108 (90 Base) MCG/ACT inhaler Inhale 2 puffs into the lungs every 6 (six) hours as needed for wheezing or shortness of breath. 18 g 1  . clonazePAM (KLONOPIN) 1 MG tablet TAKE 1 TABLET BY MOUTH EVERY MORNING AND TAKE 1/2 TO 1 TABLET MIDDAY AS NEEDED AND 1 TABLET BY MOUTH EVERY EVENING 90 tablet 1  . FLUoxetine (PROZAC) 20 MG capsule TAKE 3 CAPSULES BY MOUTH EVERY DAY 90 capsule 0  . Metoprolol Succinate 25 MG CS24 Take 12.5 tablets by mouth 2 (two) times daily. 30 capsule 0  . omeprazole (PRILOSEC) 40 MG capsule Take 1 capsule (40 mg total) by mouth daily. Use as needed for GERD (Patient taking differently: Take 40 mg by mouth daily. ) 90 capsule 3  . RETIN-A 0.1 % cream Apply topically at bedtime. (Patient taking differently: Apply 1 application topically at bedtime as needed (acne). ) 45 g 3  . topiramate (TOPAMAX) 50 MG tablet Take a 1/2 tablet  at bedtime for one week, then increase to a whole tablet at bedtime. 30 tablet 6   No current facility-administered medications on file prior to visit.      PAST SURGICAL HISTORY Past Surgical History:  Procedure Laterality Date  . LEFT HEART CATH AND CORONARY ANGIOGRAPHY N/A 05/08/2017   Procedure: LEFT HEART CATH AND CORONARY ANGIOGRAPHY;  Surgeon: Martinique, Peter M, MD;  Location: [facility];  Service: Cardiovascular;  Laterality: N/A;    FAMILY HISTORY: Family History  Problem Relation Age of Onset  . Diabetes Father   . Hypertension Father   . Heart disease Father     SOCIAL HISTORY:  reports that she has been  smoking cigarettes.  She has a 9.00 pack-year smoking history. She has never used smokeless tobacco. She reports that she does not drink alcohol or use drugs.  PERFORMANCE STATUS: The patient's performance status is 1 - Symptomatic but completely ambulatory  PHYSICAL EXAM: Most Recent Vital Signs: Blood pressure 125/69, pulse 89, resp. rate 17, weight 185 lb 4 oz (84 kg), SpO2 100 %. BP 125/69 (Patient Position: Sitting)   Pulse 89   Resp 17   Wt 185 lb 4 oz (84 kg)   SpO2 100%   BMI 32.82 kg/m   General Appearance:    Alert, cooperative, no distress, appears stated age  Head:    Normocephalic, without obvious abnormality, atraumatic  Eyes:    PERRL, conjunctiva/corneas clear, EOM's intact, fundi    benign, both eyes        Throat:   Lips, mucosa, and tongue normal; teeth and gums normal  Neck:   Supple, symmetrical, trachea midline, no adenopathy;    thyroid:  no enlargement/tenderness/nodules; no carotid   bruit or JVD  Back:     Symmetric, no curvature, ROM normal, no CVA tenderness  Lungs:     Clear to auscultation bilaterally, respirations unlabored  Chest Wall:    No tenderness or deformity   Heart:    Regular rate and rhythm, S1 and S2 normal, no murmur, rub   or gallop     Abdomen:     Soft, non-tender, bowel sounds active all four quadrants,    no masses, no organomegaly        Extremities:   Extremities normal, atraumatic, no cyanosis or edema  Pulses:   2+ and symmetric all extremities  Skin:   Skin color, texture, turgor normal, no rashes or lesions  Lymph nodes:   Cervical, supraclavicular, and axillary nodes normal  Neurologic:   CNII-XII intact, normal strength, sensation and reflexes    throughout    LABORATORY DATA:  Results for orders placed or performed in visit on 07/04/17 (from the past 48 hour(s))  CBC with Differential (Cancer Center Only)     Status: Abnormal   Collection Time: 07/04/17  3:10 PM  Result Value Ref Range   WBC Count 10.1 (H) 3.9 -  10.0 K/uL   RBC 3.98 3.70 - 5.32 MIL/uL   Hemoglobin 10.6 (L) 11.6 - 15.9 g/dL   HCT 33.0 (L) 34.8 - 46.6 %   MCV 82.9 81.0 - 101.0 fL   MCH 26.6 26.0 - 34.0 pg   MCHC 32.1 32.0 - 36.0 g/dL   RDW 15.5 11.1 - 15.7 %   Platelet Count 576 (H) 145 - 400 K/uL   Neutrophils Relative % 64 %   Neutro Abs 6.5 1.5 - 6.5 K/uL   Lymphocytes Relative 27 %   Lymphs Abs 2.7  0.9 - 3.3 K/uL   Monocytes Relative 6 %   Monocytes Absolute 0.6 0.1 - 0.9 K/uL   Eosinophils Relative 2 %   Eosinophils Absolute 0.2 0.0 - 0.5 K/uL   Basophils Relative 1 %   Basophils Absolute 0.1 0.0 - 0.1 K/uL    Comment: Performed at [facility] at [facility], [address], [place], [place] [address]  Save smear     Status: None   Collection Time: 07/04/17  3:10 PM  Result Value Ref Range   Smear Review SMEAR STAINED AND AVAILABLE FOR REVIEW     Comment: Performed at [facility] at [facility], [address] [place], [place] [address]      RADIOGRAPHY: No results found.     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 She needs to have her colonoscopy to evaluate for cause of bleed.  We will refer her back to Dr. Havery Moros who has seen her in the past for endoscopy.  Her iron saturation was 4 and ferritin 7 today. We will schedule her for IV iron next week and again the week after.  We will then plan to see her back in another 6 weeks for follow-up.   All questions were answered and she is in agreement with the plan. She will contact our office with any questions or concerns. We can certainly see her sooner if need be. .  She was discussed with and also seen by Dr. Marin Olp and he is in agreement with the aforementioned.   Laverna Peace     Addendum: I saw and examined the patient with Judson Roch.  I agree with her above assessment.  Eyelids of the  blood smear under the microscope.  I really do not see anything that looked suspicious for a myeloproliferative disorder.  She has a good maturation of her red blood cells.  She has no nucleated red blood cells.  She has no hypersegmented polys.  There is no immature myeloid or lymphoid forms.  Her platelets are increased in number.  She has good granulation of her platelets.  Her platelets are relatively uniform in size.  Iron is suspect that her thrombocytosis is likely from the iron deficiency.  She is markedly iron deficient with a ferritin of 7 and an iron saturation of only 4%.  The leukocytosis is minimal.  I really do not think this is an issue.  We will go ahead and get her on IV iron.  I know that this will help her out.  We spent about 45 minutes with her today.  Over half time was spent face-to-face with her.  We reviewed all of her lab work.  We went over recommendations.  We talked about IV iron and how it works.  She is agreeable to the IV iron.  We will plan to see her back in about 6 weeks.  I would suspect that her platelet count will improve nicely.  Lattie Haw, MD

## 2017-07-05 DIAGNOSIS — E611 Iron deficiency: Secondary | ICD-10-CM | POA: Diagnosis not present

## 2017-07-05 LAB — LACTATE DEHYDROGENASE: LDH: 255 U/L — AB (ref 125–245)

## 2017-07-05 LAB — IRON AND TIBC
IRON: 15 ug/dL — AB (ref 41–142)
SATURATION RATIOS: 4 % — AB (ref 21–57)
TIBC: 424 ug/dL (ref 236–444)
UIBC: 409 ug/dL

## 2017-07-05 LAB — RETICULOCYTES
RBC.: 3.97 MIL/uL (ref 3.70–5.45)
RETIC COUNT ABSOLUTE: 63.5 10*3/uL (ref 33.7–90.7)
RETIC CT PCT: 1.6 % (ref 0.7–2.1)

## 2017-07-05 LAB — FERRITIN: Ferritin: 7 ng/mL — ABNORMAL LOW (ref 9–269)

## 2017-07-05 LAB — ERYTHROPOIETIN: Erythropoietin: 33.1 m[IU]/mL — ABNORMAL HIGH (ref 2.6–18.5)

## 2017-07-09 ENCOUNTER — Other Ambulatory Visit: Payer: Self-pay | Admitting: Family

## 2017-07-09 DIAGNOSIS — D500 Iron deficiency anemia secondary to blood loss (chronic): Secondary | ICD-10-CM

## 2017-07-09 DIAGNOSIS — D509 Iron deficiency anemia, unspecified: Secondary | ICD-10-CM | POA: Insufficient documentation

## 2017-07-09 LAB — HEMOGLOBINOPATHY EVALUATION
HGB A2 QUANT: 1.9 % (ref 1.8–3.2)
HGB F QUANT: 0 % (ref 0.0–2.0)
HGB S QUANTITAION: 0 %
Hgb A: 98.1 % (ref 96.4–98.8)
Hgb C: 0 %
Hgb Variant: 0 %

## 2017-07-10 ENCOUNTER — Inpatient Hospital Stay: Payer: Medicaid Other

## 2017-07-10 VITALS — BP 109/68 | HR 71 | Temp 98.7°F | Resp 18

## 2017-07-10 DIAGNOSIS — E611 Iron deficiency: Secondary | ICD-10-CM | POA: Diagnosis not present

## 2017-07-10 DIAGNOSIS — D500 Iron deficiency anemia secondary to blood loss (chronic): Secondary | ICD-10-CM

## 2017-07-10 DIAGNOSIS — K625 Hemorrhage of anus and rectum: Secondary | ICD-10-CM

## 2017-07-10 MED ORDER — SODIUM CHLORIDE 0.9 % IV SOLN
Freq: Once | INTRAVENOUS | Status: AC
  Administered 2017-07-10: 14:00:00 via INTRAVENOUS

## 2017-07-10 MED ORDER — SODIUM CHLORIDE 0.9 % IV SOLN
510.0000 mg | Freq: Once | INTRAVENOUS | Status: AC
  Administered 2017-07-10: 510 mg via INTRAVENOUS
  Filled 2017-07-10: qty 17

## 2017-07-10 NOTE — Patient Instructions (Signed)

## 2017-07-17 ENCOUNTER — Other Ambulatory Visit: Payer: Self-pay

## 2017-07-17 ENCOUNTER — Inpatient Hospital Stay: Payer: Medicaid Other | Attending: Family

## 2017-07-17 VITALS — BP 118/51 | HR 72 | Temp 98.2°F | Resp 18

## 2017-07-17 DIAGNOSIS — E611 Iron deficiency: Secondary | ICD-10-CM | POA: Insufficient documentation

## 2017-07-17 DIAGNOSIS — K922 Gastrointestinal hemorrhage, unspecified: Secondary | ICD-10-CM | POA: Diagnosis present

## 2017-07-17 DIAGNOSIS — D500 Iron deficiency anemia secondary to blood loss (chronic): Secondary | ICD-10-CM

## 2017-07-17 DIAGNOSIS — K625 Hemorrhage of anus and rectum: Secondary | ICD-10-CM

## 2017-07-17 MED ORDER — FERUMOXYTOL INJECTION 510 MG/17 ML
510.0000 mg | Freq: Once | INTRAVENOUS | Status: AC
  Administered 2017-07-17: 510 mg via INTRAVENOUS
  Filled 2017-07-17: qty 17

## 2017-07-17 MED ORDER — SODIUM CHLORIDE 0.9 % IV SOLN
Freq: Once | INTRAVENOUS | Status: AC
  Administered 2017-07-17: 13:00:00 via INTRAVENOUS

## 2017-07-17 MED ORDER — PROCHLORPERAZINE MALEATE 10 MG PO TABS
ORAL_TABLET | ORAL | Status: AC
  Filled 2017-07-17: qty 1

## 2017-07-17 MED ORDER — PROCHLORPERAZINE MALEATE 10 MG PO TABS
10.0000 mg | ORAL_TABLET | Freq: Once | ORAL | Status: AC
  Administered 2017-07-17: 10 mg via ORAL

## 2017-07-17 NOTE — Patient Instructions (Signed)

## 2017-07-17 NOTE — Progress Notes (Signed)
Patient complains of dizziness and nausea after completion of Feraheme. Blood pressure 115/56. Laverna Peace, NP notified. Order given and carried out for Compazine 10 mg PO.   1401 Patient states the nausea is resolving after the compazine.   Patient reports the nausea is completely resolved. VSS. Patient discharged ambulatory without further concerns.

## 2017-07-20 ENCOUNTER — Other Ambulatory Visit: Payer: Self-pay | Admitting: Family Medicine

## 2017-07-23 ENCOUNTER — Other Ambulatory Visit: Payer: Self-pay

## 2017-08-03 ENCOUNTER — Other Ambulatory Visit: Payer: Self-pay | Admitting: Family Medicine

## 2017-08-03 DIAGNOSIS — F411 Generalized anxiety disorder: Secondary | ICD-10-CM

## 2017-08-03 NOTE — Telephone Encounter (Signed)
Requesting:Klonopin Contract:none QQU:IVHO Last Visit:06/06/17 Next Visit:none with pcp Last Refill:06/01/17 1-R  Please Advise

## 2017-08-13 ENCOUNTER — Other Ambulatory Visit: Payer: Self-pay

## 2017-08-13 ENCOUNTER — Inpatient Hospital Stay: Payer: Medicaid Other

## 2017-08-13 ENCOUNTER — Encounter: Payer: Self-pay | Admitting: Family

## 2017-08-13 ENCOUNTER — Inpatient Hospital Stay: Payer: Medicaid Other | Attending: Family | Admitting: Family

## 2017-08-13 VITALS — BP 124/63 | HR 80 | Temp 98.6°F | Resp 16 | Wt 189.0 lb

## 2017-08-13 DIAGNOSIS — F329 Major depressive disorder, single episode, unspecified: Secondary | ICD-10-CM | POA: Diagnosis not present

## 2017-08-13 DIAGNOSIS — D500 Iron deficiency anemia secondary to blood loss (chronic): Secondary | ICD-10-CM | POA: Diagnosis not present

## 2017-08-13 DIAGNOSIS — N920 Excessive and frequent menstruation with regular cycle: Secondary | ICD-10-CM | POA: Diagnosis not present

## 2017-08-13 LAB — CBC WITH DIFFERENTIAL (CANCER CENTER ONLY)
Basophils Absolute: 0.1 10*3/uL (ref 0.0–0.1)
Basophils Relative: 1 %
Eosinophils Absolute: 0.3 10*3/uL (ref 0.0–0.5)
Eosinophils Relative: 3 %
HCT: 37 % (ref 34.8–46.6)
Hemoglobin: 12.1 g/dL (ref 11.6–15.9)
Lymphocytes Relative: 26 %
Lymphs Abs: 2.8 10*3/uL (ref 0.9–3.3)
MCH: 29.4 pg (ref 26.0–34.0)
MCHC: 32.7 g/dL (ref 32.0–36.0)
MCV: 90 fL (ref 81.0–101.0)
Monocytes Absolute: 0.8 10*3/uL (ref 0.1–0.9)
Monocytes Relative: 7 %
Neutro Abs: 6.9 10*3/uL — ABNORMAL HIGH (ref 1.5–6.5)
Neutrophils Relative %: 63 %
Platelet Count: 397 10*3/uL (ref 145–400)
RBC: 4.11 MIL/uL (ref 3.70–5.32)
RDW: 20.6 % — ABNORMAL HIGH (ref 11.1–15.7)
WBC Count: 10.9 10*3/uL — ABNORMAL HIGH (ref 3.9–10.0)

## 2017-08-13 NOTE — Progress Notes (Signed)
Hematology and Oncology Follow Up Visit  Norma Goodman [id number] [birth year] 32 y.o. 08/13/2017   Principle Diagnosis:  Iron deficiency anemia secondary to heavy cycles   Current Therapy:   IV iron as indicated - last received Rayven/May x 2   Interim History: Norma Goodman is here today with her brother and friend for follow-up. She has some fatigued still but explained that she does have depression and takes Prozac daily and Klonopin as needed. This attributes to her fatigue.  She is not chewing ice anymore.  She has headaches off and on. She has Topamax as needed.  No fever, chills, n/v, cough, dizziness, SOB, chest pain, palpitations, abdominal pain or changes in bowel or bladder habits.  She will have hives after drinking caffeine or when stressed.  No tenderness, numbness or tingling in her extremities. She will have puffiness in her hands in the morning that resolves once she gets up and moves around.  No lymphadenopathy noted on exam.  No episodes of bleeding, no bruising or petechiae.  Her appetite is down but she states that she stays well hydrated.   ECOG Performance Status: 1 - Symptomatic but completely ambulatory  Medications:  Allergies as of 08/13/2017   No Known Allergies     Medication List        Accurate as of 08/13/17  2:44 PM. Always use your most recent med list.          albuterol 108 (90 Base) MCG/ACT inhaler Commonly known as:  PROVENTIL HFA Inhale 2 puffs into the lungs every 6 (six) hours as needed for wheezing or shortness of breath.   clonazePAM 1 MG tablet Commonly known as:  KLONOPIN TAKE 1 TABLET BY MOUTH EVERY MORNING AND 1/2 TO 1 TABLET AT MIDDAY AS NEEDED AND 1 TABLET EVERY EVENING   FLUoxetine 20 MG capsule Commonly known as:  PROZAC TAKE 3 CAPSULES BY MOUTH EVERY DAY   Metoprolol Succinate 25 MG Cs24 Take 12.5 tablets by mouth 2 (two) times daily.   omeprazole 40 MG capsule Commonly known as:  PRILOSEC Take 1 capsule (40 mg  total) by mouth daily. Use as needed for GERD   RETIN-A 0.1 % cream Generic drug:  tretinoin Apply topically at bedtime.   topiramate 50 MG tablet Commonly known as:  TOPAMAX Take a 1/2 tablet at bedtime for one week, then increase to a whole tablet at bedtime.       Allergies: No Known Allergies  Past Medical History, Surgical history, Social history, and Family History were reviewed and updated.  Review of Systems: All other 10 point review of systems is negative.   Physical Exam:  weight is 189 lb (85.7 kg). Her oral temperature is 98.6 F (37 C). Her blood pressure is 124/63 and her pulse is 80. Her respiration is 16 and oxygen saturation is 98%.   Wt Readings from Last 3 Encounters:  08/13/17 189 lb (85.7 kg)  07/04/17 185 lb 4 oz (84 kg)  06/06/17 199 lb 12.8 oz (90.6 kg)    Ocular: Sclerae unicteric, pupils equal, round and reactive to light Ear-nose-throat: Oropharynx clear, dentition fair Lymphatic: No cervical, supraclavicular or axillary adenopathy Lungs no rales or rhonchi, good excursion bilaterally Heart regular rate and rhythm, no murmur appreciated Abd soft, nontender, positive bowel sounds, no liver or spleen tip palpated on exam, no fluid wave  MSK no focal spinal tenderness, no joint edema Neuro: non-focal, well-oriented, appropriate affect Breasts: Deferred   Lab Results  Component Value Date  WBC 10.9 (H) 08/13/2017   HGB 12.1 08/13/2017   HCT 37.0 08/13/2017   MCV 90.0 08/13/2017   PLT 397 08/13/2017   Lab Results  Component Value Date   FERRITIN 7 (L) 07/04/2017   IRON 15 (L) 07/04/2017   TIBC 424 07/04/2017   UIBC 409 07/04/2017   IRONPCTSAT 4 (L) 07/04/2017   Lab Results  Component Value Date   RETICCTPCT 1.6 07/04/2017   RBC 4.11 08/13/2017   No results found for: KPAFRELGTCHN, LAMBDASER, KAPLAMBRATIO No results found for: IGGSERUM, IGA, IGMSERUM No results found for: Ronnald Ramp, A1GS, A2GS, Tillman Sers, SPEI   Chemistry      Component Value Date/Time   NA 145 07/04/2017 1510   K 3.8 07/04/2017 1510   CL 114 (H) 07/04/2017 1510   CO2 24 07/04/2017 1510   BUN 23 (H) 07/04/2017 1510   CREATININE 0.90 07/04/2017 1510      Component Value Date/Time   CALCIUM 9.6 07/04/2017 1510   ALKPHOS 69 07/04/2017 1510   AST 22 07/04/2017 1510   ALT 39 07/04/2017 1510   BILITOT 0.4 07/04/2017 1510      Impression and Plan: Norma Goodman is a very pleasant 32 yo female with iron deficiency anemia secondary to heavy cycles. She still has some fatigue.  Hgb has improved to 12.1, MCV 90.  We will see what her iron studies show and bring her back in for infusion if needed.  We will go ahead and plan to see her back in another 2 months for follow-up.  She will contact our office with any questions or concerns. We can certainly see her sooner if need be.   Laverna Peace, NP 6/3/20192:44 PM

## 2017-08-14 LAB — IRON AND TIBC
Iron: 83 ug/dL (ref 41–142)
SATURATION RATIOS: 31 % (ref 21–57)
TIBC: 271 ug/dL (ref 236–444)
UIBC: 188 ug/dL

## 2017-08-14 LAB — FERRITIN: Ferritin: 70 ng/mL (ref 9–269)

## 2017-09-07 ENCOUNTER — Ambulatory Visit: Payer: Medicaid Other | Admitting: Gastroenterology

## 2017-09-11 ENCOUNTER — Encounter: Payer: Self-pay | Admitting: Family

## 2017-09-27 ENCOUNTER — Other Ambulatory Visit: Payer: Self-pay | Admitting: Family Medicine

## 2017-09-27 DIAGNOSIS — F411 Generalized anxiety disorder: Secondary | ICD-10-CM

## 2017-09-27 NOTE — Telephone Encounter (Signed)
Copied from [place] [id number]. Topic: Quick Communication - See Telephone Encounter >> Sep 27, 2017  1:13 PM Mylinda Latina, NT wrote: CRM for notification. See Telephone encounter for: 09/27/17. Patient called and states she needs a refill of her FLUoxetine (PROZAC) 20 MG capsule, clonazePAM (KLONOPIN) 1 MG tablet   [id number] - [place], [place] - [address] [place] [phone] (Phone) [phone] (Fax)

## 2017-09-28 NOTE — Telephone Encounter (Signed)
Klonopin and Prozac refill Last refill:Klonopin 08/03/17 90 tab/1 refill; Prozac 07/23/17 90 cap/1 refill Last OV:06/06/17 YCX:[id number] Pharmacy: [facility] [place], [place] - [place] [place] [phone] (Phone) [phone] (Fax)

## 2017-10-01 ENCOUNTER — Telehealth: Payer: Self-pay

## 2017-10-01 ENCOUNTER — Encounter: Payer: Self-pay | Admitting: Family Medicine

## 2017-10-01 DIAGNOSIS — F411 Generalized anxiety disorder: Secondary | ICD-10-CM

## 2017-10-01 MED ORDER — CLONAZEPAM 1 MG PO TABS: ORAL_TABLET | ORAL | 1 refills | Status: DC

## 2017-10-01 NOTE — Telephone Encounter (Signed)
clonazePAM (KLONOPIN) 1 MG tablet [[id number]]   Pt called to get refill on medication above, contact to advise if needed

## 2017-10-01 NOTE — Telephone Encounter (Signed)
Copied from [place] [id number]. Topic: Appointment Scheduling - Scheduling Inquiry for Clinic >> Oct 01, 2017  9:17 AM Synthia Innocent wrote: Reason for CRM: Requesting to be worked in today for refills with Dr Lorelei Pont. Please advise

## 2017-10-01 NOTE — Telephone Encounter (Signed)
Did not see message until this afternoon. Received no phone call from patient as well requesting appointment today. Schedule is full on Wednesday, off on Thursday. Please advise. Should patient see another provider?

## 2017-10-01 NOTE — Telephone Encounter (Signed)
Refill request for clonazepam .

## 2017-10-01 NOTE — Addendum Note (Signed)
Addended by: Lamar Blinks C on: 10/01/2017 05:35 PM   Modules accepted: Orders

## 2017-10-01 NOTE — Telephone Encounter (Signed)
I did refill for her- will send mychart message and ask her to see me in the next couple of months

## 2017-10-15 ENCOUNTER — Ambulatory Visit: Payer: Medicaid Other | Admitting: Family

## 2017-10-15 ENCOUNTER — Other Ambulatory Visit: Payer: Medicaid Other

## 2017-10-16 ENCOUNTER — Inpatient Hospital Stay: Payer: Medicaid Other | Admitting: Family

## 2017-10-16 ENCOUNTER — Inpatient Hospital Stay: Payer: Medicaid Other | Attending: Family

## 2017-11-10 ENCOUNTER — Other Ambulatory Visit: Payer: Self-pay | Admitting: Family Medicine

## 2017-11-17 NOTE — Progress Notes (Signed)
[place] at [facility] [address] [place], [place] [address] [phone]  Date:  11/19/2017   Name:  Norma Goodman   DOB:  [birth year]   MRN:  [id number]  PCP:  Darreld Mclean, MD    Chief Complaint: Fatigue (1 month, sleeping more often, no energy, feels "out of it" can sit in a chair and fall asleep)   History of Present Illness:  Norma Goodman is a 32 y.o. very pleasant female patient who presents with the following:  Following up today Last seen here 6 months ago -  She has been seeing hematology as well to follow-up on iron def anemia which seems to be under control at this time  From last hematology visit in June: Impression and Plan: Ms. Hlavac is a very pleasant 32 yo female with iron deficiency anemia secondary to heavy cycles. She still has some fatigue.  Hgb has improved to 12.1, MCV 90.  We will see what her iron studies show and bring her back in for infusion if needed.  We will go ahead and plan to see her back in another 2 months for follow-up.  She will contact our office with any questions or concerns. We can certainly see her sooner if need be.   10/27/2017  1  10/01/2017  Clonazepam 1 Mg Tablet  90.00 30 Je Cop  [id number]  [facility] ([id number])  2/1 6.00 LME Medicaid  [employer]  10/01/2017  1  10/01/2017  Clonazepam 1 Mg Tablet  90.00 30 Je Cop  [id number]  [facility] ([id number])  1/1 6.00 LME Medicaid  [place]  08/31/2017  1  08/03/2017  Clonazepam 1 Mg Tablet  90.00 30 Je Cop  [id number]  [facility] ([id number])  2/1 6.00 LME Medicaid  [place]  08/03/2017  1  08/03/2017  Clonazepam 1 Mg Tablet  90.00 30 Je Cop  [id number]  [facility] ([id number])  1/1 6.00 LME Medicaid  [place]  07/02/2017  1  06/01/2017  Clonazepam 1 Mg Tablet  90.00 30 Je Cop  [id number]  [facility] ([id number])  2/1 6.00 LME Medicaid  [place]  06/04/2017  1  06/01/2017  Clonazepam 1 Mg Tablet  90.00 30 Je Cop  [id number]  [facility] ([id number])  1/      Pap: 7 years ago!  I had not been aware that she was not seeing OBG, and have not seen her for a  physical ever. She has her menses today and declines pap but will schedule this asap  Flu: due today   She notes that she is falling asleep easily/ daytime somnolence for about one month if not longer.   No change in her meds recently She stopped topamax so she knows this is not the culprit She will go to sleep somewhere between10- 11 pm, and gets up between 7-8 am She may wake up once in the night but gets back to sleep easily She does snore, but is not sure if this has gotten worse recently.  She does wonder if OSA might be the culprit  She notes that if she were to sit down and watch TV after lunch there is a "100% chance" that she would fall asleep  She is not aware of any change in her diet that might explain recent weight change but she is up about 15 lbs as below  She notes heavy menses for the last several months as well She is trying to cut back on smoking but is  still a smoker- I would hesitate to use combined OCP in her as she is 51 and a smoker    Wt Readings from Last 3 Encounters:  11/19/17 205 lb (93 kg)  08/13/17 189 lb (85.7 kg)  07/04/17 185 lb 4 oz (84 kg)   Lab Results  Component Value Date   TSH 1.072 03/02/2015      Patient Active Problem List   Diagnosis Date Noted  . IDA (iron deficiency anemia) 07/09/2017  . Chest pain 06/01/2017  . Cigarette smoker 05/04/2017  . Rectal bleeding 10/19/2014  . Frequent headaches 10/19/2014  . Anxiety state, unspecified 04/24/2012    Past Medical History:  Diagnosis Date  . Anxiety   . Depression     Past Surgical History:  Procedure Laterality Date  . LEFT HEART CATH AND CORONARY ANGIOGRAPHY N/A 05/08/2017   Procedure: LEFT HEART CATH AND CORONARY ANGIOGRAPHY;  Surgeon: Martinique, Peter M, MD;  Location: [facility];  Service: Cardiovascular;  Laterality: N/A;    Social History   Tobacco Use  . Smoking status: Heavy Tobacco Smoker    Packs/day: 1.00    Years: 9.00    Pack years: 9.00    Types:  Cigarettes  . Smokeless tobacco: Never Used  Substance Use Topics  . Alcohol use: No    Alcohol/week: 0.0 standard drinks  . Drug use: No    Family History  Problem Relation Age of Onset  . Diabetes Father   . Hypertension Father   . Heart disease Father     No Known Allergies  Medication list has been reviewed and updated.  Current Outpatient Medications on File Prior to Visit  Medication Sig Dispense Refill  . albuterol (PROVENTIL HFA) 108 (90 Base) MCG/ACT inhaler Inhale 2 puffs into the lungs every 6 (six) hours as needed for wheezing or shortness of breath. 18 g 1  . clonazePAM (KLONOPIN) 1 MG tablet TAKE 1 TABLET BY MOUTH EVERY MORNING AND 1/2 TO 1 TABLET AT MIDDAY AS NEEDED AND 1 TABLET EVERY EVENING 90 tablet 1  . FLUoxetine (PROZAC) 20 MG capsule Take 3 capsules (60 mg total) by mouth daily. 90 capsule 5  . omeprazole (PRILOSEC) 40 MG capsule Take 1 capsule (40 mg total) by mouth daily. 90 capsule 0  . RETIN-A 0.1 % cream Apply topically at bedtime. (Patient taking differently: Apply 1 application topically at bedtime as needed (acne). ) 45 g 3   No current facility-administered medications on file prior to visit.     Review of Systems:  As per HPI- otherwise negative. No fever or chills Has gained weight   Physical Examination: Vitals:   11/19/17 1018  BP: 122/84  Pulse: 99  Resp: 20  Temp: 98.7 F (37.1 C)  SpO2: 97%   Vitals:   11/19/17 1018  Weight: 205 lb (93 kg)  Height: 5\' 3"  (1.6 m)   Body mass index is 36.31 kg/m. Ideal Body Weight: Weight in (lb) to have BMI = 25: 140.8  GEN: WDWN, NAD, Non-toxic, A & O x 3, obese, otherwise looks well  HEENT: Atraumatic, Normocephalic. Neck supple. No masses, No LAD.  Bilateral TM wnl, oropharynx normal.  PEERL,EOMI.   She does have a large neck as might pre-dispose to sleep apnea Ears and Nose: No external deformity. CV: RRR, No M/G/R. No JVD. No thrill. No extra heart sounds. PULM: CTA B, no wheezes,  crackles, rhonchi. No retractions. No resp. distress. No accessory muscle use. ABD: S, NT, ND, +  BS. No rebound. No HSM. EXTR: No c/c/e NEURO Normal gait.  PSYCH: Normally interactive. Conversant. Not depressed or anxious appearing.  Calm demeanor.    Assessment and Plan: Daytime somnolence - Plan: Ambulatory referral to Neurology  Iron deficiency anemia due to chronic blood loss - Plan: CBC, Ferritin  Fatigue, unspecified type  Weight gain - Plan: TSH  Referral to neurology for sleep study, I suspect OSA as the cause of her fatigue Also check on her iron levels and thyroid Scheduled her for a pap at her earliest convenience Encouraged flu shot but she declines at this time   Signed Lamar Blinks, MD  Labs received as below:  Results for orders placed or performed in visit on 11/19/17  TSH  Result Value Ref Range   TSH 0.67 0.35 - 4.50 uIU/mL  CBC  Result Value Ref Range   WBC 10.5 4.0 - 10.5 K/uL   RBC 4.15 3.87 - 5.11 Mil/uL   Platelets 482.0 (H) 150.0 - 400.0 K/uL   Hemoglobin 12.7 12.0 - 15.0 g/dL   HCT 38.0 36.0 - 46.0 %   MCV 91.6 78.0 - 100.0 fl   MCHC 33.3 30.0 - 36.0 g/dL   RDW 14.3 11.5 - 15.5 %  Ferritin  Result Value Ref Range   Ferritin 16.7 10.0 - 291.0 ng/mL   Message to pt   Your thyroid is normal You are not anemic. However your ferritin- iron- has decreased since last visit.  I would like you to take an OTC iron supplement every other day if you are not already  See you soon for your pap! [place]

## 2017-11-19 ENCOUNTER — Encounter: Payer: Self-pay | Admitting: Family Medicine

## 2017-11-19 ENCOUNTER — Ambulatory Visit (INDEPENDENT_AMBULATORY_CARE_PROVIDER_SITE_OTHER): Payer: Medicaid Other | Admitting: Family Medicine

## 2017-11-19 VITALS — BP 122/84 | HR 99 | Temp 98.7°F | Resp 20 | Ht 63.0 in | Wt 205.0 lb

## 2017-11-19 DIAGNOSIS — D500 Iron deficiency anemia secondary to blood loss (chronic): Secondary | ICD-10-CM

## 2017-11-19 DIAGNOSIS — R635 Abnormal weight gain: Secondary | ICD-10-CM

## 2017-11-19 DIAGNOSIS — R400 Somnolence: Secondary | ICD-10-CM

## 2017-11-19 DIAGNOSIS — R5383 Other fatigue: Secondary | ICD-10-CM | POA: Diagnosis not present

## 2017-11-19 DIAGNOSIS — F411 Generalized anxiety disorder: Secondary | ICD-10-CM

## 2017-11-19 LAB — CBC
HCT: 38 % (ref 36.0–46.0)
Hemoglobin: 12.7 g/dL (ref 12.0–15.0)
MCHC: 33.3 g/dL (ref 30.0–36.0)
MCV: 91.6 fl (ref 78.0–100.0)
PLATELETS: 482 10*3/uL — AB (ref 150.0–400.0)
RBC: 4.15 Mil/uL (ref 3.87–5.11)
RDW: 14.3 % (ref 11.5–15.5)
WBC: 10.5 10*3/uL (ref 4.0–10.5)

## 2017-11-19 LAB — FERRITIN: Ferritin: 16.7 ng/mL (ref 10.0–291.0)

## 2017-11-19 LAB — TSH: TSH: 0.67 u[IU]/mL (ref 0.35–4.50)

## 2017-11-19 NOTE — Patient Instructions (Addendum)
I suspect that you have sleep apnea- this is likely why you are feeling so tired. I will set you up to see neurology and have a sleep evaluation asap  We will check on your thyroid and blood levels today Please schedule an appt to see for a pap ASAP- we really need to catch up on this screening!   I would encourage you to have a flu shot this fall to protect yourself and the rest of your family

## 2017-11-26 ENCOUNTER — Other Ambulatory Visit: Payer: Self-pay | Admitting: Family Medicine

## 2017-11-26 DIAGNOSIS — F411 Generalized anxiety disorder: Secondary | ICD-10-CM

## 2017-11-26 MED ORDER — CLONAZEPAM 1 MG PO TABS: ORAL_TABLET | ORAL | 2 refills | Status: DC

## 2017-12-01 NOTE — Progress Notes (Deleted)
[place] at [facility] [address] [place], [place] [address] [phone]  Date:  12/03/2017   Name:  Norma Goodman   DOB:  [birth year]   MRN:  [id number]  PCP:  Darreld Mclean, MD    Chief Complaint: No chief complaint on file.   History of Present Illness:  Norma Goodman is a 32 y.o. very pleasant female patient who presents with the following:  Seen here just recently for daytime somnolence at which time we realized that she is well overdue for a pap.  Will obtain pap today Last pap   Patient Active Problem List   Diagnosis Date Noted  . IDA (iron deficiency anemia) 07/09/2017  . Chest pain 06/01/2017  . Cigarette smoker 05/04/2017  . Rectal bleeding 10/19/2014  . Frequent headaches 10/19/2014  . Anxiety state, unspecified 04/24/2012    Past Medical History:  Diagnosis Date  . Anxiety   . Depression     Past Surgical History:  Procedure Laterality Date  . LEFT HEART CATH AND CORONARY ANGIOGRAPHY N/A 05/08/2017   Procedure: LEFT HEART CATH AND CORONARY ANGIOGRAPHY;  Surgeon: Martinique, Peter M, MD;  Location: [facility];  Service: Cardiovascular;  Laterality: N/A;    Social History   Tobacco Use  . Smoking status: Heavy Tobacco Smoker    Packs/day: 1.00    Years: 9.00    Pack years: 9.00    Types: Cigarettes  . Smokeless tobacco: Never Used  Substance Use Topics  . Alcohol use: No    Alcohol/week: 0.0 standard drinks  . Drug use: No    Family History  Problem Relation Age of Onset  . Diabetes Father   . Hypertension Father   . Heart disease Father     No Known Allergies  Medication list has been reviewed and updated.  Current Outpatient Medications on File Prior to Visit  Medication Sig Dispense Refill  . albuterol (PROVENTIL HFA) 108 (90 Base) MCG/ACT inhaler Inhale 2 puffs into the lungs every 6 (six) hours as needed for wheezing or shortness of breath. 18 g 1  . clonazePAM  (KLONOPIN) 1 MG tablet TAKE 1 TABLET BY MOUTH EVERY MORNING AND 1/2 TO 1 TABLET AT MIDDAY AS NEEDED AND 1 TABLET EVERY EVENING 90 tablet 2  . FLUoxetine (PROZAC) 20 MG capsule Take 3 capsules (60 mg total) by mouth daily. 90 capsule 5  . omeprazole (PRILOSEC) 40 MG capsule Take 1 capsule (40 mg total) by mouth daily. 90 capsule 0  . RETIN-A 0.1 % cream Apply topically at bedtime. (Patient taking differently: Apply 1 application topically at bedtime as needed (acne). ) 45 g 3   No current facility-administered medications on file prior to visit.     Review of Systems:  ***  Physical Examination: There were no vitals filed for this visit. There were no vitals filed for this visit. There is no height or weight on file to calculate BMI. Ideal Body Weight:    ***  Assessment and Plan: ***  Signed Lamar Blinks, MD

## 2017-12-03 ENCOUNTER — Ambulatory Visit: Payer: Medicaid Other | Admitting: Family Medicine

## 2017-12-03 DIAGNOSIS — Z0289 Encounter for other administrative examinations: Secondary | ICD-10-CM

## 2017-12-07 ENCOUNTER — Ambulatory Visit: Payer: Medicaid Other | Admitting: Neurology

## 2017-12-07 ENCOUNTER — Encounter: Payer: Self-pay | Admitting: Neurology

## 2017-12-07 VITALS — BP 110/72 | HR 92 | Ht 62.0 in | Wt 207.0 lb

## 2017-12-07 DIAGNOSIS — G479 Sleep disorder, unspecified: Secondary | ICD-10-CM

## 2017-12-07 DIAGNOSIS — R519 Headache, unspecified: Secondary | ICD-10-CM

## 2017-12-07 DIAGNOSIS — G4440 Drug-induced headache, not elsewhere classified, not intractable: Secondary | ICD-10-CM

## 2017-12-07 DIAGNOSIS — R0683 Snoring: Secondary | ICD-10-CM | POA: Diagnosis not present

## 2017-12-07 DIAGNOSIS — R400 Somnolence: Secondary | ICD-10-CM | POA: Diagnosis not present

## 2017-12-07 DIAGNOSIS — F17200 Nicotine dependence, unspecified, uncomplicated: Secondary | ICD-10-CM

## 2017-12-07 DIAGNOSIS — E669 Obesity, unspecified: Secondary | ICD-10-CM | POA: Diagnosis not present

## 2017-12-07 DIAGNOSIS — R51 Headache: Secondary | ICD-10-CM

## 2017-12-07 DIAGNOSIS — G2581 Restless legs syndrome: Secondary | ICD-10-CM

## 2017-12-07 DIAGNOSIS — G4761 Periodic limb movement disorder: Secondary | ICD-10-CM

## 2017-12-07 NOTE — Patient Instructions (Signed)

## 2017-12-07 NOTE — Progress Notes (Signed)
Subjective:    Patient ID: Norma Goodman is a 32 y.o. female.  HPI     Star Age, MD, PhD [facility] [address] [place], [place] [address]  Dear Dr. Lorelei Pont,   I saw your patient, Norma Goodman, upon your kind request, in my sleep clinic today for initial consultation of her sleep disorder, in particular, concern for underlying obstructive sleep apnea. The patient is accompanied by her sister, Andee Poles, today. As you know, Ms. Norma Goodman is a 32 year old right-handed woman with an underlying medical history of reflux disease, anxiety, depression, smoking, and obesity, who reports snoring and excessive daytime somnolence. Her Epworth sleepiness score is 18 out of 24 today, fatigue score is 62 out of 63. She smokes about half a pack per day, does not utilize alcohol on a regular basis, drinks caffeine in the form of 1 energy drink and 1 soda per day on average. Her sleepiness during the day has become worse over the past few months. She was not sleepy as a child or teenager. She is not sure if she has pauses in her breathing and sister has not witnessed any apneic pauses. Patient does not work, she takes care of her 15-year-old daughter. She denies night to night nocturia but has had morning headaches. She has had increase in anxiety in the recent past. She has never had counseling. She takes [facility] powder daily for her headaches, 3-4 doses per day on average. She is not aware of any family history of sleep apnea. She endorses restless leg symptoms and moving her legs at night and also during sleep. She tries to be in bed around 11, she does not watch TV in her bedroom. Rise time varies, between 7 and 10.  Her Past Medical History Is Significant For: Past Medical History:  Diagnosis Date  . Anxiety   . Depression     Her Past Surgical History Is Significant For: Past Surgical History:  Procedure Laterality Date  . LEFT HEART CATH AND CORONARY  ANGIOGRAPHY N/A 05/08/2017   Procedure: LEFT HEART CATH AND CORONARY ANGIOGRAPHY;  Surgeon: Martinique, Peter M, MD;  Location: [facility];  Service: Cardiovascular;  Laterality: N/A;    Her Family History Is Significant For: Family History  Problem Relation Age of Onset  . Diabetes Father   . Hypertension Father   . Heart disease Father     Her Social History Is Significant For: Social History   Socioeconomic History  . Marital status: Single    Spouse name: Not on file  . Number of children: 1  . Years of education: Not on file  . Highest education level: Not on file  Occupational History  . Not on file  Social Needs  . Financial resource strain: Not on file  . Food insecurity:    Worry: Not on file    Inability: Not on file  . Transportation needs:    Medical: Not on file    Non-medical: Not on file  Tobacco Use  . Smoking status: Heavy Tobacco Smoker    Packs/day: 1.00    Years: 9.00    Pack years: 9.00    Types: Cigarettes  . Smokeless tobacco: Never Used  Substance and Sexual Activity  . Alcohol use: No    Alcohol/week: 0.0 standard drinks  . Drug use: No  . Sexual activity: Not on file  Lifestyle  . Physical activity:    Days per week: Not on file  Minutes per session: Not on file  . Stress: Not on file  Relationships  . Social connections:    Talks on phone: Not on file    Gets together: Not on file    Attends religious service: Not on file    Active member of club or organization: Not on file    Attends meetings of clubs or organizations: Not on file    Relationship status: Not on file  Other Topics Concern  . Not on file  Social History Narrative  . Not on file    Her Allergies Are:  No Known Allergies:   Her Current Medications Are:  Outpatient Encounter Medications as of 12/07/2017  Medication Sig  . albuterol (PROVENTIL HFA) 108 (90 Base) MCG/ACT inhaler Inhale 2 puffs into the lungs every 6 (six) hours as needed for wheezing or  shortness of breath.  . clonazePAM (KLONOPIN) 1 MG tablet TAKE 1 TABLET BY MOUTH EVERY MORNING AND 1/2 TO 1 TABLET AT MIDDAY AS NEEDED AND 1 TABLET EVERY EVENING  . FLUoxetine (PROZAC) 20 MG capsule Take 3 capsules (60 mg total) by mouth daily.  Marland Kitchen omeprazole (PRILOSEC) 40 MG capsule Take 1 capsule (40 mg total) by mouth daily.  Marland Kitchen RETIN-A 0.1 % cream Apply topically at bedtime. (Patient taking differently: Apply 1 application topically at bedtime as needed (acne). )   No facility-administered encounter medications on file as of 12/07/2017.   :  Review of Systems:  Out of a complete 14 point review of systems, all are reviewed and negative with the exception of these symptoms as listed below: Review of Systems  Neurological:       Pt presents today to discuss her sleep. Pt has never had a sleep study but does endorse snoring.  Epworth Sleepiness Scale 0= would never doze 1= slight chance of dozing 2= moderate chance of dozing 3= high chance of dozing  Sitting and reading: 3 Watching TV: 3 Sitting inactive in a public place (ex. Theater or meeting): 2 As a passenger in a car for an hour without a break: 3 Lying down to rest in the afternoon: 3 Sitting and talking to someone: 2 Sitting quietly after lunch (no alcohol): 1 In a car, while stopped in traffic: 1 Total: 18     Objective:  Neurological Exam  Physical Exam Physical Examination:   Vitals:   12/07/17 0906  BP: 110/72  Pulse: 92    General Examination: The patient is a very pleasant 32 y.o. female in no acute distress. She appears well-developed and well-nourished and well groomed.   HEENT: Normocephalic, atraumatic, pupils are equal, round and reactive to light and accommodation. Extraocular tracking is good without limitation to gaze excursion or nystagmus noted. Normal smooth pursuit is noted. Hearing is grossly intact. Tympanic membranes are clear bilaterally. Face is symmetric with normal facial animation and  normal facial sensation. Speech is clear with no dysarthria noted. There is no hypophonia. There is no lip, neck/head, jaw or voice tremor. Neck is supple with full range of passive and active motion. There are no carotid bruits on auscultation. Oropharynx exam reveals: mild mouth dryness, adequate dental hygiene and mild airway crowding, due to smaller airway entry and larger appearing uvula. Tonsils are 1+ bilaterally. Mallampati is class II. Neck circumference is 17 inches. She has a moderate overbite. Tongue protrudes centrally and palate elevates symmetrically.   Chest: Clear to auscultation without wheezing, rhonchi or crackles noted.  Heart: S1+S2+0, regular and normal without murmurs, rubs  or gallops noted.   Abdomen: Soft, non-tender and non-distended with normal bowel sounds appreciated on auscultation.  Extremities: There is no pitting edema in the distal lower extremities bilaterally. Pedal pulses are intact.  Skin: Warm and dry without trophic changes noted.  Musculoskeletal: exam reveals no obvious joint deformities, tenderness or joint swelling or erythema.   Neurologically:  Mental status: The patient is awake, alert and oriented in all 4 spheres. Her immediate and remote memory, attention, language skills and fund of knowledge are appropriate. There is no evidence of aphasia, agnosia, apraxia or anomia. Speech is clear with normal prosody and enunciation. Thought process is linear. Mood is normal and affect is normal.  Cranial nerves II - XII are as described above under HEENT exam. In addition: shoulder shrug is normal with equal shoulder height noted. Motor exam: Normal bulk, strength and tone is noted. There is no drift, tremor or rebound. Romberg is negative. Reflexes are 2+ throughout. Babinski: Toes are flexor bilaterally. Fine motor skills and coordination: intact with normal finger taps, normal hand movements, normal rapid alternating patting, normal foot taps and normal  foot agility.  Cerebellar testing: No dysmetria or intention tremor. There is no truncal or gait ataxia.  Sensory exam: intact to light touch in the upper and lower extremities.  Gait, station and balance: She stands easily. No veering to one side is noted. No leaning to one side is noted. Posture is age-appropriate and stance is narrow based. Gait shows normal stride length and normal pace. No problems turning are noted.   Assessment and plan:  In summary, Norma Goodman is a very pleasant 32 y.o.-year old female with an underlying medical history of reflux disease, anxiety, depression, smoking, and obesity, whose history and physical exam are concerning for obstructive sleep apnea (OSA). I had a long chat with the patient and her sister about my findings and the diagnosis of OSA, its prognosis and treatment options. We talked about medical treatments, surgical interventions and non-pharmacological approaches. I explained in particular the risks and ramifications of untreated moderate to severe OSA, especially with respect to developing cardiovascular disease down the Road, including congestive heart failure, difficult to treat hypertension, cardiac arrhythmias, or stroke. Even type 2 diabetes has, in part, been linked to untreated OSA. Symptoms of untreated OSA include daytime sleepiness, memory problems, mood irritability and mood disorder such as depression and anxiety, lack of energy, as well as recurrent headaches, especially morning headaches. We talked about smoking cessation and trying to maintain a healthy lifestyle in general, as well as the importance of weight control. I encouraged the patient to eat healthy, exercise daily and keep well hydrated, to keep a scheduled bedtime and wake time routine, to not skip any meals and eat healthy snacks in between meals. I advised the patient not to drive when feeling sleepy. For residual anxiety, she may benefit from counseling; she is encouraged to talk  to you about it.   I recommended the following at this time: sleep study with potential positive airway pressure titration. (We will score hypopneas at 4%).   I explained the sleep test procedure to the patient and also outlined possible surgical and non-surgical treatment options of OSA, including the use of a custom-made dental device (which would require a referral to a specialist dentist or oral surgeon), upper airway surgical options, such as pillar implants, radiofrequency surgery, tongue base surgery, and UPPP (which would involve a referral to an ENT surgeon). Rarely, jaw surgery such as mandibular advancement  may be considered.  I also explained the CPAP treatment option to the patient, who indicated that she would be willing to try CPAP if the need arises. I explained the importance of being compliant with PAP treatment, not only for insurance purposes but primarily to improve Her symptoms, and for the patient's long term health benefit, including to reduce Her cardiovascular risks. I answered all her questions today and the patient was in agreement. I plan to see her back after the sleep study is completed and encouraged her to call with any interim questions, concerns, problems or updates.   Thank you very much for allowing me to participate in the care of this nice patient. If I can be of any further assistance to you please do not hesitate to call me at [phone].  Sincerely,   Star Age, MD, PhD

## 2017-12-11 ENCOUNTER — Inpatient Hospital Stay: Payer: Medicaid Other | Admitting: Family

## 2017-12-11 ENCOUNTER — Inpatient Hospital Stay: Payer: Medicaid Other

## 2017-12-13 ENCOUNTER — Ambulatory Visit: Payer: Self-pay

## 2017-12-13 NOTE — Telephone Encounter (Signed)
Patient called in with c/o "cough, chest tightness, SOB." She says "the cough started 3 days ago, then 2 days ago the chest tightness started and it's hard to get in air. My inhaler doesn't seem to be working now." I asked is the anything coming up with the cough, she denies. I asked about fever, she denies. I asked about other symptoms, she says "chest tightness and wheezing. I've taken Benadryl to help." During the triage call, patient did not appear to be in acute breathing distress, she coughed once and her children were in the background. According to protocol, see PCP within 3 days, no availability with PCP, I called the office and spoke with Raquel Sarna, [facility] who says Dr. Lorelei Pont has reached her limit and she can't override. She suggested the ED or UC or another provider. I advised the patient of the above and she says she will see another provider tomorrow, if available. Appointment scheduled for tomorrow at 1540 with Mackie Pai, [facility], care advice given, patient verbalized understanding.  Reason for Disposition . [1] Patient also has allergy symptoms (e.g., itchy eyes, clear nasal discharge, postnasal drip) AND [2] they are acting up  Answer Assessment - Initial Assessment Questions 1. ONSET: "When did the cough begin?"      3 days ago 2. SEVERITY: "How bad is the cough today?"      Still there 3. RESPIRATORY DISTRESS: "Describe your breathing."      Having trouble getting in air, inhaler not working 4. FEVER: "Do you have a fever?" If so, ask: "What is your temperature, how was it measured, and when did it start?"     No 5. HEMOPTYSIS: "Are you coughing up any blood?" If so ask: "How much?" (flecks, streaks, tablespoons, etc.)     Nothing coming up when cough 6. TREATMENT: "What have you done so far to treat the cough?" (e.g., meds, fluids, humidifier)     Inhaler, benadryl 7. CARDIAC HISTORY: "Do you have any history of heart disease?" (e.g., heart attack, congestive heart failure)       No 8. LUNG HISTORY: "Do you have any history of lung disease?"  (e.g., pulmonary embolus, asthma, emphysema)     Asthma 9. PE RISK FACTORS: "Do you have a history of blood clots?" (or: recent major surgery, recent prolonged travel, bedridden)     No 10. OTHER SYMPTOMS: "Do you have any other symptoms? (e.g., runny nose, wheezing, chest pain)      Chest tightness, wheezing 11. PREGNANCY: "Is there any chance you are pregnant?" "When was your last menstrual period?"       No, LMP last week 12. TRAVEL: "Have you traveled out of the country in the last month?" (e.g., travel history, exposures)       No  Protocols used: COUGH - ACUTE NON-PRODUCTIVE-A-AH

## 2017-12-14 ENCOUNTER — Ambulatory Visit: Payer: Medicaid Other | Admitting: Medical

## 2017-12-14 DIAGNOSIS — Z0289 Encounter for other administrative examinations: Secondary | ICD-10-CM

## 2017-12-25 ENCOUNTER — Encounter: Payer: Self-pay | Admitting: Family Medicine

## 2017-12-31 ENCOUNTER — Encounter: Payer: Self-pay | Admitting: Family

## 2017-12-31 ENCOUNTER — Other Ambulatory Visit: Payer: Self-pay | Admitting: Family

## 2017-12-31 ENCOUNTER — Inpatient Hospital Stay: Payer: Medicaid Other | Attending: Family

## 2017-12-31 ENCOUNTER — Other Ambulatory Visit: Payer: Self-pay

## 2017-12-31 ENCOUNTER — Inpatient Hospital Stay (HOSPITAL_BASED_OUTPATIENT_CLINIC_OR_DEPARTMENT_OTHER): Payer: Medicaid Other | Admitting: Family

## 2017-12-31 VITALS — BP 122/87 | HR 100 | Temp 98.1°F | Resp 17 | Wt 204.4 lb

## 2017-12-31 DIAGNOSIS — D509 Iron deficiency anemia, unspecified: Secondary | ICD-10-CM | POA: Insufficient documentation

## 2017-12-31 DIAGNOSIS — N920 Excessive and frequent menstruation with regular cycle: Secondary | ICD-10-CM | POA: Insufficient documentation

## 2017-12-31 DIAGNOSIS — D500 Iron deficiency anemia secondary to blood loss (chronic): Secondary | ICD-10-CM

## 2017-12-31 DIAGNOSIS — D473 Essential (hemorrhagic) thrombocythemia: Secondary | ICD-10-CM | POA: Diagnosis not present

## 2017-12-31 LAB — CBC WITH DIFFERENTIAL (CANCER CENTER ONLY)
ABS IMMATURE GRANULOCYTES: 0.06 10*3/uL (ref 0.00–0.07)
Basophils Absolute: 0.1 10*3/uL (ref 0.0–0.1)
Basophils Relative: 1 %
Eosinophils Absolute: 0.4 10*3/uL (ref 0.0–0.5)
Eosinophils Relative: 3 %
HCT: 41.5 % (ref 36.0–46.0)
HEMOGLOBIN: 13.2 g/dL (ref 12.0–15.0)
Immature Granulocytes: 1 %
LYMPHS PCT: 25 %
Lymphs Abs: 2.8 10*3/uL (ref 0.7–4.0)
MCH: 29.9 pg (ref 26.0–34.0)
MCHC: 31.8 g/dL (ref 30.0–36.0)
MCV: 94.1 fL (ref 80.0–100.0)
MONOS PCT: 4 %
Monocytes Absolute: 0.4 10*3/uL (ref 0.1–1.0)
NEUTROS ABS: 7.3 10*3/uL (ref 1.7–7.7)
NEUTROS PCT: 66 %
Platelet Count: 478 10*3/uL — ABNORMAL HIGH (ref 150–400)
RBC: 4.41 MIL/uL (ref 3.87–5.11)
RDW: 13.5 % (ref 11.5–15.5)
WBC: 11 10*3/uL — AB (ref 4.0–10.5)
nRBC: 0 % (ref 0.0–0.2)

## 2017-12-31 NOTE — Progress Notes (Signed)
Hematology and Oncology Follow Up Visit  Norma Goodman [id number] 04/12/1985 32 y.o. 12/31/2017   Principle Diagnosis:  Iron deficiency anemia secondary to heavy cycles   Current Therapy:   IV iron as indicated - last received Tyla/May x 2   Interim History:  Norma Goodman is here today for follow-up. She is symptomatic with fatigue, weakness, chills, chewing ice as well as intermittent numbness and tingling in her hands and feet.  Her cycles is regular and continues to be quite heavy. Her platelet count is mildly elevated today. She has had some bright red blood in her stool and states that she has seen GI but is still considering having a colonoscopy.   No bruising or petechiae.  No issue with infections. No fever, chills, n/v, cough, rash, dizziness, SOB, chest pain, palpitations, abdominal pain or changes in bowel or bladder habits.  She has puffiness in her ankles and feet off and on. No tenderness, numbness or tingling in his extremities.  No lymphadenopathy.  She has maintained a good appetite and is staying well hydrated. Her weight is stable.   ECOG Performance Status: 1 - Symptomatic but completely ambulatory  Medications:  Allergies as of 12/31/2017   No Known Allergies     Medication List        Accurate as of 12/31/17 12:10 PM. Always use your most recent med list.          albuterol 108 (90 Base) MCG/ACT inhaler Commonly known as:  PROVENTIL HFA;VENTOLIN HFA Inhale 2 puffs into the lungs every 6 (six) hours as needed for wheezing or shortness of breath.   clonazePAM 1 MG tablet Commonly known as:  KLONOPIN TAKE 1 TABLET BY MOUTH EVERY MORNING AND 1/2 TO 1 TABLET AT MIDDAY AS NEEDED AND 1 TABLET EVERY EVENING   FLUoxetine 20 MG capsule Commonly known as:  PROZAC Take 3 capsules (60 mg total) by mouth daily.   omeprazole 40 MG capsule Commonly known as:  PRILOSEC Take 1 capsule (40 mg total) by mouth daily.   RETIN-A 0.1 % cream Generic drug:   tretinoin Apply topically at bedtime.       Allergies: No Known Allergies  Past Medical History, Surgical history, Social history, and Family History were reviewed and updated.  Review of Systems: All other 10 point review of systems is negative.   Physical Exam:  weight is 204 lb 6.4 oz (92.7 kg). Her oral temperature is 98.1 F (36.7 C). Her blood pressure is 122/87 and her pulse is 100. Her respiration is 17 and oxygen saturation is 100%.   Wt Readings from Last 3 Encounters:  12/31/17 204 lb 6.4 oz (92.7 kg)  12/07/17 207 lb (93.9 kg)  11/19/17 205 lb (93 kg)    Ocular: Sclerae unicteric, pupils equal, round and reactive to light Ear-nose-throat: Oropharynx clear, dentition fair Lymphatic: No cervical, supraclavicular or axillary adenopathy Lungs no rales or rhonchi, good excursion bilaterally Heart regular rate and rhythm, no murmur appreciated Abd soft, nontender, positive bowel sounds, no liver or spleen tip palpated on exam, no fluid wave  MSK no focal spinal tenderness, no joint edema Neuro: non-focal, well-oriented, appropriate affect Breasts: Deferred   Lab Results  Component Value Date   WBC 11.0 (H) 12/31/2017   HGB 13.2 12/31/2017   HCT 41.5 12/31/2017   MCV 94.1 12/31/2017   PLT 478 (H) 12/31/2017   Lab Results  Component Value Date   FERRITIN 16.7 11/19/2017   IRON 83 08/13/2017   TIBC 271  08/13/2017   UIBC 188 08/13/2017   IRONPCTSAT 31 08/13/2017   Lab Results  Component Value Date   RETICCTPCT 1.6 07/04/2017   RBC 4.41 12/31/2017   No results found for: KPAFRELGTCHN, LAMBDASER, KAPLAMBRATIO No results found for: IGGSERUM, IGA, IGMSERUM No results found for: Ronnald Ramp, A1GS, A2GS, Tillman Sers, SPEI   Chemistry      Component Value Date/Time   NA 145 07/04/2017 1510   K 3.8 07/04/2017 1510   CL 114 (H) 07/04/2017 1510   CO2 24 07/04/2017 1510   BUN 23 (H) 07/04/2017 1510   CREATININE 0.90 07/04/2017 1510       Component Value Date/Time   CALCIUM 9.6 07/04/2017 1510   ALKPHOS 69 07/04/2017 1510   AST 22 07/04/2017 1510   ALT 39 07/04/2017 1510   BILITOT 0.4 07/04/2017 1510      Impression and Plan: Norma Goodman is a very pleasant 32 yo female with iron deficeincy anemia secondary to heavy cycles. She is symptomatic again as mentioned above.  We will see what her iron studies show and bring her back in for infusion if needed.  We will go ahead and plan to see her back in another 3 months.  She will contact our office with any questions or concerns. We can certainly see her sooner if need be.   Laverna Peace, NP 10/21/201912:10 PM

## 2018-01-01 LAB — IRON AND TIBC
IRON: 43 ug/dL (ref 41–142)
Saturation Ratios: 12 % — ABNORMAL LOW (ref 21–57)
TIBC: 369 ug/dL (ref 236–444)
UIBC: 325 ug/dL

## 2018-01-01 LAB — FERRITIN: Ferritin: 27 ng/mL (ref 11–307)

## 2018-01-02 ENCOUNTER — Telehealth: Payer: Self-pay | Admitting: Neurology

## 2018-01-02 NOTE — Telephone Encounter (Signed)
We have attempted to call the patient 2 times to schedule sleep study. Patient has been unavailable at the phone numbers we have on file and has not returned our calls. At this point we will send a letter asking pt to please contact the sleep lab to schedule their sleep study. If patient calls back we will schedule them for their sleep study. ° °

## 2018-01-08 ENCOUNTER — Inpatient Hospital Stay: Payer: Medicaid Other

## 2018-01-09 ENCOUNTER — Other Ambulatory Visit: Payer: Self-pay

## 2018-01-09 ENCOUNTER — Other Ambulatory Visit: Payer: Self-pay | Admitting: Family

## 2018-01-09 ENCOUNTER — Inpatient Hospital Stay: Payer: Medicaid Other

## 2018-01-09 VITALS — BP 116/66 | HR 84 | Temp 97.8°F | Resp 16

## 2018-01-09 DIAGNOSIS — D500 Iron deficiency anemia secondary to blood loss (chronic): Secondary | ICD-10-CM

## 2018-01-09 DIAGNOSIS — K625 Hemorrhage of anus and rectum: Secondary | ICD-10-CM

## 2018-01-09 DIAGNOSIS — D509 Iron deficiency anemia, unspecified: Secondary | ICD-10-CM | POA: Diagnosis not present

## 2018-01-09 DIAGNOSIS — N920 Excessive and frequent menstruation with regular cycle: Secondary | ICD-10-CM | POA: Diagnosis not present

## 2018-01-09 MED ORDER — SODIUM CHLORIDE 0.9 % IV SOLN
510.0000 mg | Freq: Once | INTRAVENOUS | Status: AC
  Administered 2018-01-09: 510 mg via INTRAVENOUS
  Filled 2018-01-09: qty 17

## 2018-01-09 MED ORDER — SODIUM CHLORIDE 0.9 % IV SOLN
INTRAVENOUS | Status: DC
  Administered 2018-01-09: 15:00:00 via INTRAVENOUS
  Filled 2018-01-09: qty 250

## 2018-01-09 MED ORDER — SODIUM CHLORIDE 0.9 % IV SOLN
INTRAVENOUS | Status: DC
  Administered 2018-01-09: 16:00:00 via INTRAVENOUS
  Filled 2018-01-09 (×2): qty 250

## 2018-01-09 MED ORDER — SODIUM CHLORIDE 0.9% FLUSH
3.0000 mL | Freq: Once | INTRAVENOUS | Status: DC | PRN
  Filled 2018-01-09: qty 10

## 2018-01-09 MED ORDER — SODIUM CHLORIDE 0.9% FLUSH
10.0000 mL | INTRAVENOUS | Status: DC | PRN
  Filled 2018-01-09: qty 10

## 2018-01-09 NOTE — Patient Instructions (Signed)

## 2018-01-09 NOTE — Progress Notes (Signed)
BP after infusion 86/55. Laverna Peace, NP aware and gave an verbal order for 250 ml NS at 500 ml/hr.

## 2018-02-19 NOTE — Progress Notes (Deleted)
[facility] [address], [place], [place] [address] [phone]  Date:  02/20/2018   Name:  Norma Goodman   DOB:  1985/07/04   MRN:  [id number]  PCP:  Darreld Mclean, MD    Chief Complaint: No chief complaint on file.   History of Present Illness:  Norma Goodman is a 32 y.o. very pleasant female patient who presents with the following:  Following up on her medications today Last seen here in September at which time she had complained of excessive sleepiness.  We referred her to neurology, and they ordered a sleep study for her.  It looks like so far this has not been scheduled, as they could not get in touch with the patient. She is also seeing hematology for iron deficiency anemia. Pap-Biance is quite overdue.  We will encourage her to do this today.  NCCSR:  01/22/2018  1   11/26/2017  Clonazepam 1 Mg Tablet  90.00 30 Je Cop  [id number]  [place] ([id number])  2/2 6.00 LME Medicaid  [place]  12/23/2017  1   11/26/2017  Clonazepam 1 Mg Tablet  90.00 30 Je Cop  [id number]  [facility] ([id number])  1/2 6.00 LME Medicaid  [place]  11/27/2017  1   11/26/2017  Clonazepam 1 Mg Tablet  90.00 30 Je Cop  [id number]  [place] ([id number])  0/2 6.00 LME Medicaid  [place]  10/27/2017  1   10/01/2017  Clonazepam 1 Mg Tablet  90.00 30 Je Cop  [id number]  [place] ([id number])  1/1 6.00 LME Medicaid  [place]  10/01/2017  1   10/01/2017  Clonazepam 1 Mg Tablet  90.00 30 Je Cop  [id number]  [place] ([id number])  0/1 6.00 LME Medicaid  [place]  08/31/2017  1   08/03/2017  Clonazepam 1 Mg Tablet  90.00 30 Je Cop  [id number]  [facility] ([id number])  1/1 6.00 LME Medicaid  [place]  08/03/2017  1   08/03/2017  Clonazepam 1 Mg Tablet  90.00 30 Je Cop  [id number]  [place] ([id number])  0/1 6.00 LME Medicaid  [place]  07/02/2017  1   06/01/2017  Clonazepam 1 Mg Tablet  90.00 30 Je Cop  [id number]  [place] ([id number])  1/1 6.00 LME Medicaid  [place]  06/04/2017  1   06/01/2017  Clonazepam 1 Mg Tablet  90.00 30 Je Cop  [id number]  [place] ([id number])  0/1 6.00 LME Medicaid  [place]  05/09/2017  1   05/09/2017   Clonazepam 1 Mg Tablet  90.00 30 Je Cop  [id number]  [place] ([id number])  0/0 6.00 LME Medicaid  [place]  04/09/2017  1   04/09/2017  Clonazepam 1 Mg Tablet  90.00 30 Je Cop  [id number]  [place] ([id number])  0/0 6.00 LME Medicaid  [place]  03/14/2017  1   01/04/2017  Clonazepam 1 Mg Tablet  90.00 30 Je Cop  [id number]  [place] ([id number])  1/1 6.00 LME Medicaid    02/14/2017  1   01/04/2017  Clonazepam 1 Mg Tablet  90.00 30 Je Cop  [id number]  [place] ([id number])  0/1        Patient Active Problem List   Diagnosis Date Noted  . IDA (iron deficiency anemia) 07/09/2017  . Chest pain 06/01/2017  . Cigarette smoker 05/04/2017  . Rectal bleeding 10/19/2014  . Frequent headaches 10/19/2014  . Anxiety state, unspecified 04/24/2012    Past Medical History:  Diagnosis Date  . Anxiety   . Depression  Past Surgical History:  Procedure Laterality Date  . LEFT HEART CATH AND CORONARY ANGIOGRAPHY N/A 05/08/2017   Procedure: LEFT HEART CATH AND CORONARY ANGIOGRAPHY;  Surgeon: Martinique, Peter M, MD;  Location: [facility];  Service: Cardiovascular;  Laterality: N/A;    Social History   Tobacco Use  . Smoking status: Heavy Tobacco Smoker    Packs/day: 1.00    Years: 9.00    Pack years: 9.00    Types: Cigarettes  . Smokeless tobacco: Never Used  Substance Use Topics  . Alcohol use: No    Alcohol/week: 0.0 standard drinks  . Drug use: No    Family History  Problem Relation Age of Onset  . Diabetes Father   . Hypertension Father   . Heart disease Father     No Known Allergies  Medication list has been reviewed and updated.  Current Outpatient Medications on File Prior to Visit  Medication Sig Dispense Refill  . albuterol (PROVENTIL HFA) 108 (90 Base) MCG/ACT inhaler Inhale 2 puffs into the lungs every 6 (six) hours as needed for wheezing or shortness of breath. 18 g 1  . clonazePAM (KLONOPIN) 1 MG tablet TAKE 1 TABLET BY MOUTH EVERY MORNING AND 1/2 TO 1 TABLET AT MIDDAY AS NEEDED AND 1 TABLET EVERY EVENING 90 tablet 2  .  FLUoxetine (PROZAC) 20 MG capsule Take 3 capsules (60 mg total) by mouth daily. 90 capsule 5  . omeprazole (PRILOSEC) 40 MG capsule Take 1 capsule (40 mg total) by mouth daily. 90 capsule 0  . RETIN-A 0.1 % cream Apply topically at bedtime. (Patient taking differently: Apply 1 application topically at bedtime as needed (acne). ) 45 g 3   No current facility-administered medications on file prior to visit.     Review of Systems:  As per HPI- otherwise negative.   Physical Examination: There were no vitals filed for this visit. There were no vitals filed for this visit. There is no height or weight on file to calculate BMI. Ideal Body Weight:    GEN: WDWN, NAD, Non-toxic, A & O x 3 HEENT: Atraumatic, Normocephalic. Neck supple. No masses, No LAD. Ears and Nose: No external deformity. CV: RRR, No M/G/R. No JVD. No thrill. No extra heart sounds. PULM: CTA B, no wheezes, crackles, rhonchi. No retractions. No resp. distress. No accessory muscle use. ABD: S, NT, ND, +BS. No rebound. No HSM. EXTR: No c/c/e NEURO Normal gait.  PSYCH: Normally interactive. Conversant. Not depressed or anxious appearing.  Calm demeanor.    Assessment and Plan: ***  Signed Lamar Blinks, MD

## 2018-02-20 ENCOUNTER — Ambulatory Visit: Payer: Medicaid Other | Admitting: Family Medicine

## 2018-02-23 ENCOUNTER — Other Ambulatory Visit: Payer: Self-pay | Admitting: Family Medicine

## 2018-02-23 DIAGNOSIS — L700 Acne vulgaris: Secondary | ICD-10-CM

## 2018-02-23 DIAGNOSIS — F411 Generalized anxiety disorder: Secondary | ICD-10-CM

## 2018-02-25 ENCOUNTER — Encounter: Payer: Self-pay | Admitting: Family Medicine

## 2018-02-25 NOTE — Telephone Encounter (Signed)
I have asked my office manager to discharge this pt due to frequent no shows.  I am not sure if she got this message yet.   I have alerted pt about this issue via mychart  NCCSR:  01/22/2018  1   11/26/2017  Clonazepam 1 Mg Tablet  90.00 30 Je Cop  [id number]  [facility] ([id number])  2/2 6.00 LME Medicaid  [place]  12/23/2017  1   11/26/2017  Clonazepam 1 Mg Tablet  90.00 30 Je Cop  [id number]  Wal ([id number])  1/2 6.00 LME Medicaid  [place]  11/27/2017  1   11/26/2017  Clonazepam 1 Mg Tablet  90.00 30 Je Cop  [id number]  [facility] ([id number])  0/2 6.00 LME Medicaid  [place]  10/27/2017  1   10/01/2017  Clonazepam 1 Mg Tablet  90.00 30 Je Cop  [id number]  [facility] ([id number])  1/1 6.00 LME Medicaid  [place]  10/01/2017  1   10/01/2017  Clonazepam 1 Mg Tablet  90.00 30 Je Cop  [id number]  [facility] ([id number])  0/1 6.00 LME Medicaid  [place]  08/31/2017  1   08/03/2017  Clonazepam 1 Mg Tablet  90.00 30 Je Cop  [id number]  Wal [phone])  1/1

## 2018-02-26 ENCOUNTER — Telehealth: Payer: Self-pay

## 2018-02-26 NOTE — Telephone Encounter (Signed)
Copied from [place] [id number]. Topic: General - Inquiry >> Feb 26, 2018  9:14 AM Margot Ables wrote: Reason for CRM: Pt and brother received the mychart msg from Dr. Lorelei Pont. Charlotte Crumb states that the pt didn't miss appointments intentionally and asking if Dr. Lorelei Pont will give her another chance. He promises that he will bring her to her appointments. He said Sheilla takes care of her mother and her handicap daughter (hearing loss). Charlotte Crumb said that Nevaya is trying to juggle a lot. Please call pt to advise of final decision.

## 2018-02-26 NOTE — Telephone Encounter (Signed)
-----   Message from Darreld Mclean, MD sent at 02/25/2018  6:35 PM EST ----- Can you please make sure Larene Beach got my message about discharging this patient? Thank you

## 2018-02-27 ENCOUNTER — Encounter: Payer: Self-pay | Admitting: Family Medicine

## 2018-02-27 NOTE — Telephone Encounter (Signed)
Ok, we will give her one more chance.  Message to pt

## 2018-02-28 ENCOUNTER — Other Ambulatory Visit: Payer: Self-pay

## 2018-02-28 DIAGNOSIS — L700 Acne vulgaris: Secondary | ICD-10-CM

## 2018-02-28 DIAGNOSIS — F411 Generalized anxiety disorder: Secondary | ICD-10-CM

## 2018-02-28 NOTE — Telephone Encounter (Signed)
I refilled these both on 12/16- called pharmacy to confirm they were picked up

## 2018-02-28 NOTE — Telephone Encounter (Signed)
Received fax from [facility] requesting refill of patient's clonazepam 1 mg and Retin-A 0.1 cream. Please advis on refill.

## 2018-03-21 ENCOUNTER — Emergency Department (HOSPITAL_BASED_OUTPATIENT_CLINIC_OR_DEPARTMENT_OTHER)
Admission: EM | Admit: 2018-03-21 | Discharge: 2018-03-22 | Disposition: A | Discharge status: Home / Self Care | Payer: Medicaid Other | Attending: Emergency Medicine | Admitting: Emergency Medicine

## 2018-03-21 ENCOUNTER — Encounter: Payer: Self-pay | Admitting: Family Medicine

## 2018-03-21 ENCOUNTER — Ambulatory Visit (INDEPENDENT_AMBULATORY_CARE_PROVIDER_SITE_OTHER): Payer: Medicaid Other | Admitting: Family Medicine

## 2018-03-21 ENCOUNTER — Emergency Department (HOSPITAL_BASED_OUTPATIENT_CLINIC_OR_DEPARTMENT_OTHER): Payer: Medicaid Other

## 2018-03-21 ENCOUNTER — Other Ambulatory Visit: Payer: Self-pay

## 2018-03-21 ENCOUNTER — Encounter (HOSPITAL_BASED_OUTPATIENT_CLINIC_OR_DEPARTMENT_OTHER): Payer: Self-pay | Admitting: *Deleted

## 2018-03-21 VITALS — BP 110/74 | HR 96 | Temp 98.2°F | Resp 16 | Ht 62.0 in | Wt 204.0 lb

## 2018-03-21 DIAGNOSIS — R109 Unspecified abdominal pain: Secondary | ICD-10-CM

## 2018-03-21 DIAGNOSIS — F339 Major depressive disorder, recurrent, unspecified: Secondary | ICD-10-CM | POA: Diagnosis not present

## 2018-03-21 DIAGNOSIS — F411 Generalized anxiety disorder: Secondary | ICD-10-CM | POA: Diagnosis not present

## 2018-03-21 DIAGNOSIS — N920 Excessive and frequent menstruation with regular cycle: Secondary | ICD-10-CM | POA: Diagnosis not present

## 2018-03-21 DIAGNOSIS — N83201 Unspecified ovarian cyst, right side: Secondary | ICD-10-CM | POA: Insufficient documentation

## 2018-03-21 DIAGNOSIS — K219 Gastro-esophageal reflux disease without esophagitis: Secondary | ICD-10-CM | POA: Diagnosis not present

## 2018-03-21 DIAGNOSIS — K8020 Calculus of gallbladder without cholecystitis without obstruction: Secondary | ICD-10-CM | POA: Diagnosis not present

## 2018-03-21 DIAGNOSIS — R1011 Right upper quadrant pain: Secondary | ICD-10-CM | POA: Diagnosis present

## 2018-03-21 DIAGNOSIS — F17210 Nicotine dependence, cigarettes, uncomplicated: Secondary | ICD-10-CM | POA: Insufficient documentation

## 2018-03-21 DIAGNOSIS — Z79899 Other long term (current) drug therapy: Secondary | ICD-10-CM | POA: Diagnosis not present

## 2018-03-21 LAB — COMPREHENSIVE METABOLIC PANEL
ALT: 15 U/L (ref 0–35)
AST: 11 U/L (ref 0–37)
Albumin: 4.1 g/dL (ref 3.5–5.2)
Alkaline Phosphatase: 60 U/L (ref 39–117)
BUN: 21 mg/dL (ref 6–23)
CO2: 25 mEq/L (ref 19–32)
Calcium: 9.4 mg/dL (ref 8.4–10.5)
Chloride: 108 mEq/L (ref 96–112)
Creatinine, Ser: 0.87 mg/dL (ref 0.40–1.20)
GFR: 78.99 mL/min (ref >60.00)
Glucose, Bld: 95 mg/dL (ref 70–99)
Potassium: 4.4 mEq/L (ref 3.5–5.1)
Sodium: 140 mEq/L (ref 135–145)
Total Bilirubin: 0.1 mg/dL — ABNORMAL LOW (ref 0.2–1.2)
Total Protein: 6.4 g/dL (ref 6.0–8.3)

## 2018-03-21 LAB — POCT URINALYSIS DIP (MANUAL ENTRY)
Bilirubin, UA: NEGATIVE
Glucose, UA: NEGATIVE mg/dL
Nitrite, UA: NEGATIVE
Spec Grav, UA: >=1.03 — AB (ref 1.010–1.025)
UROBILINOGEN UA: 1 U/dL
pH, UA: 5.5 (ref 5.0–8.0)

## 2018-03-21 LAB — CBC
HCT: 43.1 % (ref 36.0–46.0)
Hemoglobin: 14.5 g/dL (ref 12.0–15.0)
MCHC: 33.6 g/dL (ref 30.0–36.0)
MCV: 93.1 fl (ref 78.0–100.0)
Platelets: 407 10*3/uL — ABNORMAL HIGH (ref 150.0–400.0)
RBC: 4.63 Mil/uL (ref 3.87–5.11)
RDW: 14.1 % (ref 11.5–15.5)
WBC: 12.2 10*3/uL — ABNORMAL HIGH (ref 4.0–10.5)

## 2018-03-21 LAB — POCT URINE PREGNANCY: Preg Test, Ur: NEGATIVE

## 2018-03-21 MED ORDER — CLONAZEPAM 1 MG PO TABS: ORAL_TABLET | ORAL | 1 refills | Status: DC

## 2018-03-21 MED ORDER — OMEPRAZOLE 40 MG PO CPDR: 40.0000 mg | DELAYED_RELEASE_CAPSULE | Freq: Every day | ORAL | 3 refills | Status: DC

## 2018-03-21 MED ORDER — FLUOXETINE HCL 20 MG PO CAPS: 60.0000 mg | ORAL_CAPSULE | Freq: Every day | ORAL | 3 refills | Status: AC

## 2018-03-21 NOTE — Patient Instructions (Addendum)
It was nice to see you today, we are going to try determine the cause of your right side pain. We will do some blood work for you today. Also, we will get an ultrasound of your gallbladder at 2:30.  Please go to the imaging department on the ground floor around 2:15 I will get your reports back later today, and will give you a call Once we get more information I can prescribe medication for you as appropriate. I will also refer you to gynecology, I think an IUD might be a good option for you.  This may help cut down your menstrual bleeding so anemia is not such a problem for you

## 2018-03-21 NOTE — ED Triage Notes (Signed)
She was seen by her MD today for right upper quad pain. She was told she may have gallstones. She is here for possible [place] of her gallbladder.

## 2018-03-21 NOTE — Progress Notes (Addendum)
[place] at [facility] [address] [place], [place] [address] [phone]  Date:  03/21/2018   Name:  Norma Goodman   DOB:  1985-11-04   MRN:  [id number]  PCP:  Darreld Mclean, MD    Chief Complaint: Medication Refill   History of Present Illness:  Norma Goodman is a 33 y.o. very pleasant female patient who presents with the following:  Here today for a recheck visit  She was last seen here in September.    She did see neurology in September and they wanted to do a sleep study but she did not end up following up on this She did an iron infusion on 10/30, she has iron deficiency thought due to menorrhagia  She is still having very heavy menses that last 1-2 weeks  She did try OCP as a young teen, she did not use for long. She is not sure why she stopped using this, she does not recall having any adverse side effects We discussed starting her on OCP again, but she is nearly 33 years old and is a smoker so estrogen may not be ideal for her.  She is interested in getting IUD, so I will refer her to GYN  She notes pain in her right side, this has been present off an on for about 10 days Gradual onset  She first noted this at night while she was in bed It can be "pretty intense so I can't drive" It is not worse after eating No vomiting or diarrhea Never had this in the past  She is not aware of any gallbladder issues in her family   No urinary sx or pelvic pain  Patient has not been sexually active for about 2 years since she broke up with her husband, she does not suspect any exposure to STD  Again declines to do a pap   She tried tylenol and ibuprofen which is somewhat helpful for pain  She is accompanied today by a female cousin, who contributes some of the history  She also needs refills of her medications today, she is taking Prozac 60 mg/day and also uses Klonopin approximately 3 times daily for  anxiety   02/25/2018  1   02/25/2018  Clonazepam 1 Mg Tablet  90.00 30 Je Cop  [id number]  [facility] ([id number])  0/0 6.00 LME Medicaid  [place]  01/22/2018  1   11/26/2017  Clonazepam 1 Mg Tablet  90.00 30 Je Cop  [id number]  [facility] ([id number])  2/2 6.00 LME Medicaid  [place]  12/23/2017  1   11/26/2017  Clonazepam 1 Mg Tablet  90.00 30 Je Cop  [id number]  Wal ([id number])  1/2 6.00 LME Medicaid  [place]  11/27/2017  1   11/26/2017  Clonazepam 1 Mg Tablet  90.00 30 Je Cop  [id number]  Wal ([id number])  0/2 6.00 LME Medicaid  [place]  10/27/2017  1   10/01/2017  Clonazepam 1 Mg Tablet  90.00 30 Je Cop  [id number]  Wal ([id number])  1/1 6.00 LME Medicaid  [place]  10/01/2017  1   10/01/2017  Clonazepam 1 Mg Tablet  90.00 30 Je Cop  [id number]  Wal ([id number])  0/1 6.00 LME Medicaid  [place]  08/31/2017  1   08/03/2017  Clonazepam 1 Mg Tablet  90.00 30 Je Cop  [id number]  Wal ([id number])  1/1 6.00 LME Medicaid    08/03/2017  1   08/03/2017  Clonazepam 1 Mg  Tablet  90.00 30 Je Cop  [id number]  Wal [phone])  0/1 6.00 LME Medicaid  [place]  07/02/2017  1   06/01/2017  Clonazepam 1 Mg Tablet  90.00 30 Je Cop  [id number]  Wal ([id number])  1/1 6.00 LME Medicaid  [place]  06/04/2017  1   06/01/2017  Clonazepam 1 Mg Tablet  90.00 30 Je Cop  [id number]  Wal ([id number])  0/1 6.00 LME Medicaid  [place]  05/09/2017  1   05/09/2017  Clonazepam 1 Mg Tablet  90.00 30 Je Cop  [id number]  Wal [phone])  0/0      Pulse Readings from Last 3 Encounters:  03/21/18 96  01/09/18 84  12/31/17 100     Patient Active Problem List   Diagnosis Date Noted  . IDA (iron deficiency anemia) 07/09/2017  . Chest pain 06/01/2017  . Cigarette smoker 05/04/2017  . Rectal bleeding 10/19/2014  . Frequent headaches 10/19/2014  . Anxiety state, unspecified 04/24/2012    Past Medical History:  Diagnosis Date  . Anxiety   . Depression     Past Surgical History:  Procedure Laterality Date  . LEFT HEART CATH AND CORONARY ANGIOGRAPHY N/A 05/08/2017   Procedure: LEFT HEART CATH AND CORONARY ANGIOGRAPHY;  Surgeon: Martinique, Peter M, MD;  Location: [facility];   Service: Cardiovascular;  Laterality: N/A;    Social History   Tobacco Use  . Smoking status: Heavy Tobacco Smoker    Packs/day: 1.00    Years: 9.00    Pack years: 9.00    Types: Cigarettes  . Smokeless tobacco: Never Used  Substance Use Topics  . Alcohol use: No    Alcohol/week: 0.0 standard drinks  . Drug use: No    Family History  Problem Relation Age of Onset  . Diabetes Father   . Hypertension Father   . Heart disease Father     No Known Allergies  Medication list has been reviewed and updated.  Current Outpatient Medications on File Prior to Visit  Medication Sig Dispense Refill  . albuterol (PROVENTIL HFA) 108 (90 Base) MCG/ACT inhaler Inhale 2 puffs into the lungs every 6 (six) hours as needed for wheezing or shortness of breath. 18 g 1  . clonazePAM (KLONOPIN) 1 MG tablet TAKE 1 TABLET EVERY MORNING& ONE-HALF- 1 TABLET AT MIDDAY AS NEEDED& 1 TABLET EVERY EVENING 90 tablet 0  . FLUoxetine (PROZAC) 20 MG capsule Take 3 capsules (60 mg total) by mouth daily. 90 capsule 5  . omeprazole (PRILOSEC) 40 MG capsule Take 1 capsule (40 mg total) by mouth daily. 90 capsule 0  . RETIN-A 0.1 % cream Apply 1 application topically at bedtime as needed (acne). 45 g 1   No current facility-administered medications on file prior to visit.     Review of Systems:  As per HPI- otherwise negative.   Physical Examination: Vitals:   03/21/18 1257  BP: 110/74  Pulse: (!) 120  Resp: 16  SpO2: 98%   Vitals:   03/21/18 1257  Weight: 204 lb (92.5 kg)  Height: 5\' 2"  (1.575 m)   Body mass index is 37.31 kg/m. Ideal Body Weight: Weight in (lb) to have BMI = 25: 136.4  GEN: WDWN, NAD, Non-toxic, A & O x 3, obese, appears her normal self HEENT: Atraumatic, Normocephalic. Neck supple. No masses, No LAD. Ears and Nose: No external deformity. CV: RRR, No M/G/R. No JVD. No thrill. No extra heart sounds. PULM: CTA B, no wheezes, crackles, rhonchi. No retractions. No  resp. distress.  No accessory muscle use. ABD: S,  ND, +BS. No rebound. No HSM. She has vague abdominal tenderness on exam today, most focused in the right upper quadrant and over the right oblique muscle.  She is not tender in the right lower quadrant or over McBurney's point EXTR: No c/c/e NEURO Normal gait.  PSYCH: Normally interactive. Conversant. Not depressed or anxious appearing.  Calm demeanor.    Assessment and Plan: Right sided abdominal pain - Plan: Urine Culture, POCT urinalysis dipstick, POCT urine pregnancy, US Abdomen Limited RUQ, CBC, Comprehensive metabolic panel, CANCELED: CBC, CANCELED: Comprehensive metabolic panel  Menorrhagia with regular cycle - Plan: Ambulatory referral to Obstetrics / Gynecology  Generalized anxiety disorder - Plan: clonazePAM (KLONOPIN) 1 MG tablet  Depression, recurrent ([place]) - Plan: FLUoxetine (PROZAC) 20 MG capsule  Gastroesophageal reflux disease, esophagitis presence not specified - Plan: omeprazole (PRILOSEC) 40 MG capsule  Here today for a follow-up visit.  She has menorrhagia which has resulted in iron deficiency anemia.  Referral to OB/GYN for potential IUD. Refilled her Prozac, Prilosec, and clonazepam. She also mentions right side abdominal pain which has been present for about 10 days.  Duration of pain makes appendicitis unlikely.  Greatest suspicion is for gallbladder disease, this may also be muscular pain. Ordered a stat ultrasound for this afternoon.  Advised patient I will call her with this report,and we will plan the next step.  Patient and her cousin are eager to find a cause for her pain as they plan to leave in 2 days to travel to [place] to help a family member who is having surgery  At 6pm realized her ultrasound was moved to tomorrow, I am not sure why.  Called patient to discuss but she did not answer.  I left a message on her machine that I will look for this report tomorrow.  However, if she is not doing okay tonight, having more severe  pain/fever/vomiting please go to the emergency room.  Received her labs as below, her white blood cell count is slightly elevated.  This is not unusual for her, but if her ultrasound is negative will consider CT scan Signed Lamar Blinks, MD  Called patient a few times, was able to reach her about 8:15 PM. we discussed her ultrasound-it was moved to tomorrow as there was concern of her not being n.p.o.  Unfortunately I was not informed that the stat ultrasound I had ordered was rescheduled.  Discussed this in detail with patient.  She does have an elevated white blood cell count, which could indicate acute gallbladder disease or less likely appendicitis.  My most conservative advice is to have her go to the emergency room tonight so she can be fully evaluated.  Norma Goodman understands this advice, however she does not have childcare and does not feel comfortable leaving her daughter with any currently available family members. Our plan is therefore to proceed with the ultrasound tomorrow 11 AM, and take it from there.  She understands that if her ultrasound is unrevealing, she may need to go the emergency room for a CT scan after all  Results for orders placed or performed in visit on 03/21/18  CBC  Result Value Ref Range   WBC 12.2 (H) 4.0 - 10.5 K/uL   RBC 4.63 3.87 - 5.11 Mil/uL   Platelets 407.0 (H) 150.0 - 400.0 K/uL   Hemoglobin 14.5 12.0 - 15.0 g/dL   HCT 43.1 36.0 - 46.0 %   MCV 93.1 78.0 - 100.0  fl   MCHC 33.6 30.0 - 36.0 g/dL   RDW 14.1 11.5 - 15.5 %  Comprehensive metabolic panel  Result Value Ref Range   Sodium 140 135 - 145 mEq/L   Potassium 4.4 3.5 - 5.1 mEq/L   Chloride 108 96 - 112 mEq/L   CO2 25 19 - 32 mEq/L   Glucose, Bld 95 70 - 99 mg/dL   BUN 21 6 - 23 mg/dL   Creatinine, Ser 0.87 0.40 - 1.20 mg/dL   Total Bilirubin 0.1 (L) 0.2 - 1.2 mg/dL   Alkaline Phosphatase 60 39 - 117 U/L   AST 11 0 - 37 U/L   ALT 15 0 - 35 U/L   Total Protein 6.4 6.0 - 8.3 g/dL   Albumin 4.1  3.5 - 5.2 g/dL   Calcium 9.4 8.4 - 10.5 mg/dL   GFR 78.99 >60.00 mL/min  POCT urinalysis dipstick  Result Value Ref Range   Color, UA red (A) yellow   Clarity, UA cloudy (A) clear   Glucose, UA negative negative mg/dL   Bilirubin, UA negative negative   Ketones, POC UA trace (5) (A) negative mg/dL   Spec Grav, UA >=1.030 (A) 1.010 - 1.025   Blood, UA large (A) negative   pH, UA 5.5 5.0 - 8.0   Protein Ur, POC =30 (A) negative mg/dL   Urobilinogen, UA 1.0 0.2 or 1.0 E.U./dL   Nitrite, UA Negative Negative   Leukocytes, UA Small (1+) (A) Negative  POCT urine pregnancy  Result Value Ref Range   Preg Test, Ur Negative Negative   Blood in urine is likely due to current menstruation

## 2018-03-21 NOTE — ED Notes (Signed)
Pt reports that she had bloodwork done today at her PCP. States that she is here now for an ultrasound. Reports RUQ pain x 10 days intermittenly.

## 2018-03-22 ENCOUNTER — Encounter (HOSPITAL_BASED_OUTPATIENT_CLINIC_OR_DEPARTMENT_OTHER): Payer: Self-pay | Admitting: Emergency Medicine

## 2018-03-22 ENCOUNTER — Emergency Department (HOSPITAL_BASED_OUTPATIENT_CLINIC_OR_DEPARTMENT_OTHER): Payer: Medicaid Other

## 2018-03-22 ENCOUNTER — Telehealth: Payer: Self-pay | Admitting: Family Medicine

## 2018-03-22 ENCOUNTER — Ambulatory Visit (HOSPITAL_BASED_OUTPATIENT_CLINIC_OR_DEPARTMENT_OTHER): Payer: Medicaid Other

## 2018-03-22 LAB — URINALYSIS, ROUTINE W REFLEX MICROSCOPIC
Bilirubin Urine: NEGATIVE
Glucose, UA: NEGATIVE mg/dL
KETONES UR: 15 mg/dL — AB
Leukocytes, UA: NEGATIVE
NITRITE: NEGATIVE
PH: 6.5 (ref 5.0–8.0)
Protein, ur: NEGATIVE mg/dL
Specific Gravity, Urine: 1.02 (ref 1.005–1.030)

## 2018-03-22 LAB — URINE CULTURE
MICRO NUMBER:: 33621
SPECIMEN QUALITY:: ADEQUATE

## 2018-03-22 LAB — URINALYSIS, MICROSCOPIC (REFLEX): RBC / HPF: >50 RBC/hpf (ref 0–5)

## 2018-03-22 LAB — PREGNANCY, URINE: Preg Test, Ur: NEGATIVE

## 2018-03-22 MED ORDER — HYDROCODONE-ACETAMINOPHEN 5-325 MG PO TABS: 1.0000 | ORAL_TABLET | Freq: Three times a day (TID) | ORAL | 0 refills | Status: DC | PRN

## 2018-03-22 MED ORDER — DICYCLOMINE HCL 20 MG PO TABS: 20.0000 mg | ORAL_TABLET | Freq: Two times a day (BID) | ORAL | 0 refills | Status: DC

## 2018-03-22 MED ORDER — MELOXICAM 7.5 MG PO TABS: 7.5000 mg | ORAL_TABLET | Freq: Every day | ORAL | 0 refills | Status: DC

## 2018-03-22 MED ORDER — DICYCLOMINE HCL 10 MG/ML IM SOLN
20.0000 mg | Freq: Once | INTRAMUSCULAR | Status: AC
  Administered 2018-03-22: 20 mg via INTRAMUSCULAR
  Filled 2018-03-22: qty 2

## 2018-03-22 MED ORDER — ALUM & MAG HYDROXIDE-SIMETH 200-200-20 MG/5ML PO SUSP
30.0000 mL | Freq: Once | ORAL | Status: AC
  Administered 2018-03-22: 30 mL via ORAL
  Filled 2018-03-22: qty 30

## 2018-03-22 NOTE — Telephone Encounter (Signed)
Called her to discuss.  She is seeing GYN on Monday, has an appt for likely ovarian cyst She tried the mobic but it did not relive her pain She requests something else for pain- decided on hydrocodone, she is advised not to take with clonopin which she reports taking once per day in the am only.   She is advised that this medication can cause sedation and will use with caution.

## 2018-03-22 NOTE — ED Provider Notes (Signed)
[facility] EMERGENCY DEPARTMENT Provider Note   CSN: [id number] Arrival date & time: 03/21/18  2232     History   Chief Complaint Chief Complaint  Patient presents with  . Abdominal Pain    HPI Norma Goodman is a 33 y.o. female.  The history is provided by the patient.  Abdominal Pain  Pain location:  RUQ Pain quality: sharp   Pain radiates to:  Does not radiate Pain severity:  Severe Onset quality:  Sudden Duration:  10 days Timing:  Constant Progression:  Waxing and waning Chronicity:  New Context: not trauma   Relieved by:  Nothing Worsened by:  Nothing Ineffective treatments:  Acetaminophen Associated symptoms: no anorexia, no chest pain, no constipation, no diarrhea, no fever, no nausea, no vaginal discharge and no vomiting   Patient reports she was told by her PMD to come to the Ed for ongoing 10/10 pain in the RUQ as she needs an ultrasound as her physician will be out for town for some time per her report.  No f/c/r.  No n/v/d.   States it is not associated with food but reports a high fat diet.  Is having her period but denies discharge or pelvic pain.     Past Medical History:  Diagnosis Date  . Anxiety   . Depression     Patient Active Problem List   Diagnosis Date Noted  . IDA (iron deficiency anemia) 07/09/2017  . Chest pain 06/01/2017  . Cigarette smoker 05/04/2017  . Rectal bleeding 10/19/2014  . Frequent headaches 10/19/2014  . Anxiety state, unspecified 04/24/2012    Past Surgical History:  Procedure Laterality Date  . LEFT HEART CATH AND CORONARY ANGIOGRAPHY N/A 05/08/2017   Procedure: LEFT HEART CATH AND CORONARY ANGIOGRAPHY;  Surgeon: Martinique, Peter M, MD;  Location: [facility];  Service: Cardiovascular;  Laterality: N/A;     OB History   No obstetric history on file.      Home Medications    Prior to Admission medications   Medication Sig Start Date End Date Taking? Authorizing Provider  albuterol (PROVENTIL  HFA) 108 (90 Base) MCG/ACT inhaler Inhale 2 puffs into the lungs every 6 (six) hours as needed for wheezing or shortness of breath. 01/31/17   Copland, Gay Filler, MD  clonazePAM (KLONOPIN) 1 MG tablet TAKE 1 TABLET EVERY MORNING& ONE-HALF- 1 TABLET AT MIDDAY AS NEEDED& 1 TABLET EVERY EVENING 03/21/18   Copland, Gay Filler, MD  dicyclomine (BENTYL) 20 MG tablet Take 1 tablet (20 mg total) by mouth 2 (two) times daily. 03/22/18   Raileigh Sabater, Crystalmarie, MD  FLUoxetine (PROZAC) 20 MG capsule Take 3 capsules (60 mg total) by mouth daily. 03/21/18   Copland, Gay Filler, MD  omeprazole (PRILOSEC) 40 MG capsule Take 1 capsule (40 mg total) by mouth daily. 03/21/18   Copland, Gay Filler, MD  RETIN-A 0.1 % cream Apply 1 application topically at bedtime as needed (acne). 02/25/18   Copland, Gay Filler, MD    Family History Family History  Problem Relation Age of Onset  . Diabetes Father   . Hypertension Father   . Heart disease Father     Social History Social History   Tobacco Use  . Smoking status: Heavy Tobacco Smoker    Packs/day: 1.00    Years: 9.00    Pack years: 9.00    Types: Cigarettes  . Smokeless tobacco: Never Used  Substance Use Topics  . Alcohol use: No    Alcohol/week: 0.0 standard  drinks  . Drug use: No     Allergies   Patient has no known allergies.   Review of Systems Review of Systems  Constitutional: Negative for appetite change and fever.  Cardiovascular: Negative for chest pain.  Gastrointestinal: Positive for abdominal pain. Negative for abdominal distention, anorexia, constipation, diarrhea, nausea, rectal pain and vomiting.  Genitourinary: Negative for decreased urine volume, flank pain, genital sores, pelvic pain, vaginal discharge and vaginal pain.  All other systems reviewed and are negative.    Physical Exam Updated Vital Signs BP (!) 94/52 (BP Location: Right Arm)   Pulse 61   Temp (!) 97.4 F (36.3 C) (Oral)   Resp 18   Ht 5\' 2"  (1.575 m)   Wt 92.5 kg   SpO2  100%   BMI 37.31 kg/m   Physical Exam Vitals signs and nursing note reviewed.  Constitutional:      General: She is not in acute distress.    Appearance: She is well-developed. She is obese. She is not ill-appearing, toxic-appearing or diaphoretic.     Comments: Resting comfortably in the bed, smiling but stating pain is a 10/10  HENT:     Head: Normocephalic and atraumatic.     Mouth/Throat:     Mouth: Mucous membranes are moist.  Eyes:     Extraocular Movements: Extraocular movements intact.  Cardiovascular:     Rate and Rhythm: Normal rate and regular rhythm.     Heart sounds: Normal heart sounds.  Pulmonary:     Effort: Pulmonary effort is normal. No respiratory distress.     Breath sounds: Normal breath sounds.  Abdominal:     General: Abdomen is flat. Bowel sounds are normal. There is no distension or abdominal bruit.     Palpations: Abdomen is soft.     Tenderness: There is no abdominal tenderness. There is no guarding or rebound. Negative signs include Murphy's sign, Rovsing's sign and McBurney's sign.     Hernia: No hernia is present.     Comments: Resting comfortably on exam no tenderness.  No pelvic tenderness  Musculoskeletal: Normal range of motion.  Skin:    General: Skin is warm and dry.     Capillary Refill: Capillary refill takes less than 2 seconds.  Neurological:     General: No focal deficit present.     Mental Status: She is alert and oriented to person, place, and time.     Cranial Nerves: No cranial nerve deficit.  Psychiatric:        Thought Content: Thought content normal.     Comments: Calm resting comfortably       ED Treatments / Results  Labs (all labs ordered are listed, but only abnormal results are displayed) Labs Reviewed  URINALYSIS, ROUTINE W REFLEX MICROSCOPIC - Abnormal; Notable for the following components:      Result Value   APPearance CLOUDY (*)    Hgb urine dipstick LARGE (*)    Ketones, ur 15 (*)    All other components  within normal limits  URINALYSIS, MICROSCOPIC (REFLEX) - Abnormal; Notable for the following components:   Bacteria, UA RARE (*)    All other components within normal limits  PREGNANCY, URINE  CBC WITH DIFFERENTIAL/PLATELET  COMPREHENSIVE METABOLIC PANEL    EKG None  Radiology Ct Renal Stone Study  Result Date: 03/22/2018 CLINICAL DATA:  Right upper quadrant pain for 10 days. Cholelithiasis on ultrasound. Hematuria. EXAM: CT ABDOMEN AND PELVIS WITHOUT CONTRAST TECHNIQUE: Multidetector CT imaging of the abdomen  and pelvis was performed following the standard protocol without IV contrast. COMPARISON:  Ultrasound right upper quadrant 03/21/2018. CT chest 02/16/2017 FINDINGS: Lower chest: Focal small patchy areas of infiltration or atelectasis in the lung bases. Small esophageal hiatal hernia. Hepatobiliary: No focal liver abnormality is seen. No gallstones, gallbladder wall thickening, or biliary dilatation. Pancreas: Unremarkable. No pancreatic ductal dilatation or surrounding inflammatory changes. Spleen: Normal in size without focal abnormality. Adrenals/Urinary Tract: Adrenal glands are unremarkable. Kidneys are normal, without renal calculi, focal lesion, or hydronephrosis. Bladder is unremarkable. Stomach/Bowel: Stomach, small bowel, and colon are not abnormally distended. Stool fills the colon. No wall thickening or inflammatory changes appreciated. Scattered diverticula in the colon. Appendix is normal. Vascular/Lymphatic: No significant vascular findings are present. No enlarged abdominal or pelvic lymph nodes. Reproductive: Enlarged right ovary measuring 5.8 x 4.1 cm. Probably represents cystic change but can't exclude solid mass or torsion. Ultrasound pelvis recommended for further evaluation. Left ovary and uterus are unremarkable. Other: No free air or free fluid in the abdomen. Abdominal wall musculature appears intact. Musculoskeletal: No acute or significant osseous findings. IMPRESSION:  Enlarged right ovary measuring 5.8 x 4.1 cm. This may represent cystic change but Can't exclude solid mass or torsion. Ultrasound pelvis recommended for further evaluation. No evidence of bowel obstruction or inflammation. Appendix is normal. Electronically Signed   By: Lucienne Capers M.D.   On: 03/22/2018 01:48   US Abdomen Limited Ruq  Result Date: 03/21/2018 CLINICAL DATA:  Right upper quadrant pain for 10 days. Elevated white cell count. Patient ate a fatty meal about 3 hours ago. EXAM: ULTRASOUND ABDOMEN LIMITED RIGHT UPPER QUADRANT COMPARISON:  None. FINDINGS: Gallbladder: Small stones seen in the gallbladder measuring 2 mm. Mild sludge layering in the dependent gallbladder. No gallbladder wall thickening or edema. Murphy's sign is negative. Common bile duct: Diameter: 4 mm, normal Liver: No focal lesion identified. Within normal limits in parenchymal echogenicity. Portal vein is patent on color Doppler imaging with normal direction of blood flow towards the liver. IMPRESSION: Cholelithiasis and mild gallbladder sludge. No additional changes to suggest acute cholecystitis. Electronically Signed   By: Lucienne Capers M.D.   On: 03/21/2018 23:57    Procedures Procedures (including critical care time)  Medications Ordered in ED Medications  dicyclomine (BENTYL) injection 20 mg (20 mg Intramuscular Given 03/22/18 0037)  alum & mag hydroxide-simeth (MAALOX/MYLANTA) 200-200-20 MG/5ML suspension 30 mL (30 mLs Oral Given 03/22/18 0038)   Patient fell asleep in the room post CT, but still rating her pain 10/10.    210 Case d/w Dr. Rosana Hoes of the faculty practice GYN.  Symptoms, history and exam are not consistent with ovarian  torsion and patient does not need to be transferred to [facility] emergently.  CT is not the modality to diagnose ovarian pathology.  Patient needs to be seen outpatient by gynecology for full exam and ultrasound as deemed appropriate.      The patient is not reporting pelvic or  RLQ pain and is sleeping in the room on reevaluation though rating her pain 10.  Clinically she does not have ovarian torsion.  Furthermore, she has no Murphy's sign nor acute finding of cholecystitis on [place].  I have advised a bland low fat diet, and close follow up with general surgery to discuss elective removal of her gall bladder.  Moreover, I have advised the patient of the right ovarian lesion discovered on CT scan, I have given pelvic pain precautions and recommended calling gynecology for close follow up  exam and imaging of ovarian lesion found incidentally on CT scan.  With nurse present, patient verbalizes understanding of all verbal and written instructions and agrees to call surgery and gynecology to follow up for ongoing evaluation and treatment.    Final Clinical Impressions(s) / ED Diagnoses   Final diagnoses:  Calculus of gallbladder without cholecystitis without obstruction  Cyst of right ovary   Return for painfevers >100.4 unrelieved by medication, shortness of breath, intractable vomiting, or diarrhea, abdominal pain, pelvic pain, Inability to tolerate liquids or food, cough, altered mental status or any concerns. No signs of systemic illness or infection. The patient is nontoxic-appearing on exam and vital signs are within normal limits.   I have reviewed the triage vital signs and the nursing notes. Pertinent labs &imaging results that were available during my care of the patient were reviewed by me and considered in my medical decision making (see chart for details).  After history, exam, and medical workup I feel the patient has been appropriately medically screened and is safe for discharge home. Pertinent diagnoses were discussed with the patient. Patient was given return precautions. ED Discharge Orders         Ordered    dicyclomine (BENTYL) 20 MG tablet  2 times daily     03/22/18 0225           Latrease Kunde, Awa, MD 03/22/18 [id number]

## 2018-03-22 NOTE — ED Notes (Signed)
Pt sleeping.  Woken up to be discharged.

## 2018-03-23 ENCOUNTER — Other Ambulatory Visit: Payer: Self-pay | Admitting: Family Medicine

## 2018-03-23 DIAGNOSIS — R109 Unspecified abdominal pain: Secondary | ICD-10-CM

## 2018-03-27 ENCOUNTER — Other Ambulatory Visit: Payer: Self-pay | Admitting: Family Medicine

## 2018-03-27 DIAGNOSIS — R062 Wheezing: Secondary | ICD-10-CM

## 2018-04-01 ENCOUNTER — Inpatient Hospital Stay: Payer: Medicaid Other | Admitting: Family

## 2018-04-01 ENCOUNTER — Inpatient Hospital Stay: Payer: Medicaid Other

## 2018-04-02 ENCOUNTER — Encounter: Payer: Self-pay | Admitting: Family Medicine

## 2018-04-02 ENCOUNTER — Inpatient Hospital Stay: Payer: Medicaid Other | Attending: Hematology & Oncology

## 2018-04-02 ENCOUNTER — Telehealth: Payer: Self-pay | Admitting: Family

## 2018-04-02 ENCOUNTER — Inpatient Hospital Stay (HOSPITAL_BASED_OUTPATIENT_CLINIC_OR_DEPARTMENT_OTHER): Payer: Medicaid Other | Admitting: Family

## 2018-04-02 ENCOUNTER — Other Ambulatory Visit: Payer: Self-pay

## 2018-04-02 ENCOUNTER — Ambulatory Visit (INDEPENDENT_AMBULATORY_CARE_PROVIDER_SITE_OTHER): Payer: Medicaid Other | Admitting: Family Medicine

## 2018-04-02 VITALS — BP 115/73 | HR 98 | Temp 98.1°F | Resp 20 | Wt 202.5 lb

## 2018-04-02 VITALS — BP 122/76 | HR 86 | Temp 98.1°F | Resp 16 | Ht 62.0 in | Wt 203.0 lb

## 2018-04-02 DIAGNOSIS — N920 Excessive and frequent menstruation with regular cycle: Secondary | ICD-10-CM

## 2018-04-02 DIAGNOSIS — D500 Iron deficiency anemia secondary to blood loss (chronic): Secondary | ICD-10-CM | POA: Insufficient documentation

## 2018-04-02 DIAGNOSIS — L03011 Cellulitis of right finger: Secondary | ICD-10-CM | POA: Diagnosis not present

## 2018-04-02 LAB — CBC WITH DIFFERENTIAL (CANCER CENTER ONLY)
Abs Immature Granulocytes: 0.07 10*3/uL (ref 0.00–0.07)
Basophils Absolute: 0.1 10*3/uL (ref 0.0–0.1)
Basophils Relative: 1 %
Eosinophils Absolute: 0.3 10*3/uL (ref 0.0–0.5)
Eosinophils Relative: 3 %
HCT: 41.2 % (ref 36.0–46.0)
Hemoglobin: 13.9 g/dL (ref 12.0–15.0)
Immature Granulocytes: 1 %
Lymphocytes Relative: 20 %
Lymphs Abs: 2.1 10*3/uL (ref 0.7–4.0)
MCH: 31.5 pg (ref 26.0–34.0)
MCHC: 33.7 g/dL (ref 30.0–36.0)
MCV: 93.4 fL (ref 80.0–100.0)
Monocytes Absolute: 0.7 10*3/uL (ref 0.1–1.0)
Monocytes Relative: 6 %
Neutro Abs: 7.5 10*3/uL (ref 1.7–7.7)
Neutrophils Relative %: 69 %
Platelet Count: 467 10*3/uL — ABNORMAL HIGH (ref 150–400)
RBC: 4.41 MIL/uL (ref 3.87–5.11)
RDW: 13.7 % (ref 11.5–15.5)
WBC Count: 10.7 10*3/uL — ABNORMAL HIGH (ref 4.0–10.5)
nRBC: 0 % (ref 0.0–0.2)

## 2018-04-02 MED ORDER — CHLORHEXIDINE GLUCONATE 4 % EX LIQD: CUTANEOUS | 0 refills | Status: DC

## 2018-04-02 MED ORDER — CEPHALEXIN 500 MG PO CAPS: 500.0000 mg | ORAL_CAPSULE | Freq: Four times a day (QID) | ORAL | 0 refills | Status: DC

## 2018-04-02 MED ORDER — MUPIROCIN CALCIUM 2 % NA OINT: TOPICAL_OINTMENT | NASAL | 0 refills | Status: DC

## 2018-04-02 NOTE — Telephone Encounter (Signed)
Appointments scheduled letter/calendar mailed per 1/21 los

## 2018-04-02 NOTE — Progress Notes (Signed)
Hematology and Oncology Follow Up Visit  Norma Goodman [id number] 08/12/1985 32 y.o. 04/02/2018   Principle Diagnosis:  Iron deficiency anemia secondary to heavy cycles  Current Therapy:   IV iron as indicated    Interim History:  Norma Goodman is here today with her cousin for follow-up. She is symptomatic with fatigue, SOB, chills, bruising easily but not in excess.  No fever, cough, rash, dizziness, chest pain, palpitations or changes in bowel or bladder habits.  Her cycles is still quite heavy and lasts up to 2 weeks. No other bleeding. She is waiting to here from Gynecology to schedule a new patient appointment.   She has had her nails done and appears to have an infection of the cuticle. She plans to stop in Dr. Arlyn Dunning office to discuss her right upper quadrant pain with gall stones and will also let her assess her finger.  She has also had some n/v with the gallstones.  No swelling, tenderness, numbness or tingling in her extremities at this time.  No lymphadenopathy noted on exam.  She has no appetite but states that she is staying well hydrated at homes. Her weight is stable.   ECOG Performance Status: 1 - Symptomatic but completely ambulatory  Medications:  Allergies as of 04/02/2018   No Known Allergies     Medication List       Accurate as of April 02, 2018  1:58 PM. Always use your most recent med list.        clonazePAM 1 MG tablet Commonly known as:  KLONOPIN TAKE 1 TABLET EVERY MORNING& ONE-HALF- 1 TABLET AT MIDDAY AS NEEDED& 1 TABLET EVERY EVENING   FLUoxetine 20 MG capsule Commonly known as:  PROZAC Take 3 capsules (60 mg total) by mouth daily.   omeprazole 40 MG capsule Commonly known as:  PRILOSEC Take 1 capsule (40 mg total) by mouth daily.   PROVENTIL HFA 108 (90 Base) MCG/ACT inhaler Generic drug:  albuterol INHALE 2 PUFFS INTO THE LUNGS EVERY 6 HOURS AS NEEDED FOR WHEEZING OR SHORTNESS OF BREATH   RETIN-A 0.1 % cream Generic drug:   tretinoin Apply 1 application topically at bedtime as needed (acne).       Allergies: No Known Allergies  Past Medical History, Surgical history, Social history, and Family History were reviewed and updated.  Review of Systems: All other 10 point review of systems is negative.   Physical Exam:  weight is 202 lb 8 oz (91.9 kg). Her oral temperature is 98.1 F (36.7 C). Her blood pressure is 115/73 and her pulse is 98. Her respiration is 20 and oxygen saturation is 100%.   Wt Readings from Last 3 Encounters:  04/02/18 202 lb 8 oz (91.9 kg)  03/21/18 204 lb (92.5 kg)  03/21/18 204 lb (92.5 kg)    Ocular: Sclerae unicteric, pupils equal, round and reactive to light Ear-nose-throat: Oropharynx clear, dentition fair Lymphatic: No cervical, supraclavicular or axillary adenopathy Lungs no rales or rhonchi, good excursion bilaterally Heart regular rate and rhythm, no murmur appreciated Abd soft, nontender, positive bowel sounds, no liver or spleen tip palpated on exam, no fluid wave  MSK no focal spinal tenderness, no joint edema Neuro: non-focal, well-oriented, appropriate affect Breasts: Deferred   Lab Results  Component Value Date   WBC 10.7 (H) 04/02/2018   HGB 13.9 04/02/2018   HCT 41.2 04/02/2018   MCV 93.4 04/02/2018   PLT 467 (H) 04/02/2018   Lab Results  Component Value Date   FERRITIN 27  12/31/2017   IRON 43 12/31/2017   TIBC 369 12/31/2017   UIBC 325 12/31/2017   IRONPCTSAT 12 (L) 12/31/2017   Lab Results  Component Value Date   RETICCTPCT 1.6 07/04/2017   RBC 4.41 04/02/2018   No results found for: KPAFRELGTCHN, LAMBDASER, KAPLAMBRATIO No results found for: IGGSERUM, IGA, IGMSERUM No results found for: Odetta Pink, SPEI   Chemistry      Component Value Date/Time   NA 140 03/21/2018 1338   K 4.4 03/21/2018 1338   CL 108 03/21/2018 1338   CO2 25 03/21/2018 1338   BUN 21 03/21/2018 1338    CREATININE 0.87 03/21/2018 1338   CREATININE 0.90 07/04/2017 1510      Component Value Date/Time   CALCIUM 9.4 03/21/2018 1338   ALKPHOS 60 03/21/2018 1338   AST 11 03/21/2018 1338   AST 22 07/04/2017 1510   ALT 15 03/21/2018 1338   ALT 39 07/04/2017 1510   BILITOT 0.1 (L) 03/21/2018 1338   BILITOT 0.4 07/04/2017 1510       Impression and Plan: Norma Goodman is a very pleasant 33 yo female with iron deficiency anemia secondary to heavy cycles. She is symptomatic as mentioned above.  Hgb is 13.9, MCV 93, platelets mildly elevated at 467.  We will see what her iron studies show and bring her back in for infusion if needed.  We will see her back in another 3 months.  She will contact our office with any questions or concerns. We can certainly see her sooner if need be.    Laverna Peace, NP 1/21/20201:58 PM

## 2018-04-02 NOTE — Patient Instructions (Addendum)
Advil or aleve for pain.  Use chlorhexidine and warm water soaks 2-3 x a day for 15 minutes. Use Bactroban cream/ointment after every soak.  Keep area covered.  Take the antibiotic every 6 hours for 7 days Follow up with your PCP Friday or Monday. If worsening despite treatment report to Urgent care.        Paronychia Paronychia is an infection of the skin. It happens near a fingernail or toenail. It may cause pain and swelling around the nail. In some cases, a fluid-filled bump (abscess) can form near or under the nail. Usually, this condition is not serious, and it clears up with treatment. Follow these instructions at home: Wound care  Keep the affected area clean.  Soak the fingers or toes in warm water as told by your doctor. You may be told to do this for 20 minutes, 2-3 times a day.  Keep the area dry when you are not soaking it.  Do not try to drain a fluid-filled bump on your own.  Follow instructions from your doctor about how to take care of the affected area. Make sure you: ? Wash your hands with soap and water before you change your bandage (dressing). If you cannot use soap and water, use hand sanitizer. ? Change your bandage as told by your doctor.  If you had a fluid-filled bump and your doctor drained it, check the area every day for signs of infection. Check for: ? Redness, swelling, or pain. ? Fluid or blood. ? Warmth. ? Pus or a bad smell. Medicines   Take over-the-counter and prescription medicines only as told by your doctor.  If you were prescribed an antibiotic medicine, take it as told by your doctor. Do not stop taking it even if you start to feel better. General instructions  Avoid touching any chemicals.  Do not pick at the affected area. Prevention  To prevent this condition from happening again: ? Wear rubber gloves when putting your hands in water for washing dishes or other tasks. ? Wear gloves if your hands might touch cleaners or  chemicals. ? Avoid injuring your nails or fingertips. ? Do not bite your nails or tear hangnails. ? Do not cut your nails very short. ? Do not cut the skin at the base and sides of the nail (cuticles). ? Use clean nail clippers or scissors when trimming nails. Contact a doctor if:  You feel worse.  You do not get better.  You have more fluid, blood, or pus coming from the affected area.  Your finger or knuckle is swollen or is hard to move. Get help right away if you have:  A fever or chills.  Redness spreading from the affected area.  Pain in a joint or muscle. Summary  Paronychia is an infection of the skin. It happens near a fingernail or toenail.  This condition may cause pain and swelling around the nail.  Soak the fingers or toes in warm water as told by your doctor.  Usually, this condition is not serious, and it clears up with treatment. This information is not intended to replace advice given to you by your health care provider. Make sure you discuss any questions you have with your health care provider. Document Released: 02/15/2009 Document Revised: 03/12/2017 Document Reviewed: 03/12/2017 Elsevier Interactive Patient Education  2019 [employer].

## 2018-04-02 NOTE — Progress Notes (Signed)
Norma Goodman , February 01, 1986, 33 y.o., female MRN: [id number] Patient Care Team    Relationship Specialty Notifications Start End  Copland, Gay Filler, MD PCP - General Family Medicine  03/21/18     Chief Complaint  Patient presents with  . Edema    of Rt ring finger x 2wks, red, and painful     Subjective: Pt presents for an OV with complaints of right  finger swelling and pain of 2 weeks duration.  Associated symptoms include redness. She is a smoker. She had her nails (acrylic) placed 2.5 weeks ago. She denies fever, chills, drainage. Pt has tried nothing to ease their symptoms.  Depression screen [facility] 2/9 08/25/2015 03/02/2015 01/25/2015 09/07/2014  Decreased Interest 3 2 3  0  Down, Depressed, Hopeless 3 2 3  0  PHQ - 2 Score 6 4 6  0  Altered sleeping 3 2 3  -  Tired, decreased energy 3 2 3  -  Change in appetite 3 2 3  -  Feeling bad or failure about yourself  0 2 0 -  Trouble concentrating 3 2 3  -  Moving slowly or fidgety/restless (No Data) 2 0 -  Suicidal thoughts 0 0 0 -  PHQ-9 Score 18 16 18  -  Difficult doing work/chores Very difficult Very difficult Somewhat difficult -    No Known Allergies Social History   Tobacco Use  . Smoking status: Heavy Tobacco Smoker    Packs/day: 1.00    Years: 9.00    Pack years: 9.00    Types: Cigarettes  . Smokeless tobacco: Never Used  Substance Use Topics  . Alcohol use: No    Alcohol/week: 0.0 standard drinks   Past Medical History:  Diagnosis Date  . Anxiety   . Depression    Past Surgical History:  Procedure Laterality Date  . LEFT HEART CATH AND CORONARY ANGIOGRAPHY N/A 05/08/2017   Procedure: LEFT HEART CATH AND CORONARY ANGIOGRAPHY;  Surgeon: Martinique, Peter M, MD;  Location: [facility];  Service: Cardiovascular;  Laterality: N/A;   Family History  Problem Relation Age of Onset  . Diabetes Father   . Hypertension Father   . Heart disease Father    Allergies as of 04/02/2018   No Known Allergies       Medication List       Accurate as of April 02, 2018  3:39 PM. Always use your most recent med list.        clonazePAM 1 MG tablet Commonly known as:  KLONOPIN TAKE 1 TABLET EVERY MORNING& ONE-HALF- 1 TABLET AT MIDDAY AS NEEDED& 1 TABLET EVERY EVENING   FLUoxetine 20 MG capsule Commonly known as:  PROZAC Take 3 capsules (60 mg total) by mouth daily.   omeprazole 40 MG capsule Commonly known as:  PRILOSEC Take 1 capsule (40 mg total) by mouth daily.   PROVENTIL HFA 108 (90 Base) MCG/ACT inhaler Generic drug:  albuterol INHALE 2 PUFFS INTO THE LUNGS EVERY 6 HOURS AS NEEDED FOR WHEEZING OR SHORTNESS OF BREATH   RETIN-A 0.1 % cream Generic drug:  tretinoin Apply 1 application topically at bedtime as needed (acne).       All past medical history, surgical history, allergies, family history, immunizations andmedications were updated in the EMR today and reviewed under the history and medication portions of their EMR.     ROS: Negative, with the exception of above mentioned in HPI   Objective:  BP 122/76 (BP Location: Left Arm, Patient Position: Sitting, Cuff Size: Normal)  Pulse 86   Temp 98.1 F (36.7 C) (Oral)   Resp 16   Ht 5\' 2"  (1.575 m)   Wt 203 lb (92.1 kg)   LMP 03/21/2018 (Approximate)   SpO2 99%   BMI 37.13 kg/m  Body mass index is 37.13 kg/m. Gen: Afebrile. No acute distress. Nontoxic in appearance, well developed, well nourished.  HENT: AT. [place]. MMM CV: RRR  MSK: acrylic nails in place. right ring finger with redness and swelling lateral nail bed fold and proximal nail. TTP. NV intact. Good cap refill. No obvious abscess.  Neuro: Normal gait. PERLA. EOMi. Alert. Oriented x3   No exam data present No results found. Results for orders placed or performed in visit on 04/02/18 (from the past 24 hour(s))  CBC with Differential (Cancer Center Only)     Status: Abnormal   Collection Time: 04/02/18  1:36 PM  Result Value Ref Range   WBC Count 10.7 (H) 4.0  - 10.5 K/uL   RBC 4.41 3.87 - 5.11 MIL/uL   Hemoglobin 13.9 12.0 - 15.0 g/dL   HCT 41.2 36.0 - 46.0 %   MCV 93.4 80.0 - 100.0 fL   MCH 31.5 26.0 - 34.0 pg   MCHC 33.7 30.0 - 36.0 g/dL   RDW 13.7 11.5 - 15.5 %   Platelet Count 467 (H) 150 - 400 K/uL   nRBC 0.0 0.0 - 0.2 %   Neutrophils Relative % 69 %   Neutro Abs 7.5 1.7 - 7.7 K/uL   Lymphocytes Relative 20 %   Lymphs Abs 2.1 0.7 - 4.0 K/uL   Monocytes Relative 6 %   Monocytes Absolute 0.7 0.1 - 1.0 K/uL   Eosinophils Relative 3 %   Eosinophils Absolute 0.3 0.0 - 0.5 K/uL   Basophils Relative 1 %   Basophils Absolute 0.1 0.0 - 0.1 K/uL   Immature Granulocytes 1 %   Abs Immature Granulocytes 0.07 0.00 - 0.07 K/uL    Assessment/Plan: Lashina Temkin is a 33 y.o. female present for OV for  Paronychia of right ring finger - had pt soak in warm water and peroxide to cleanse nail and finger for better visualization.  - aleve or advil OTC  for pain.  - Hibiclens/warm water soaks 2-3x a day for 15 minutes; prescribed also provided her w./ Internet picture if insurance does not cover she can purchase small bottle OTC.  - avoid smoking.  - may need acrylic nail eventually removed. - Bactroban placement to nailfold after each soak.  - Keflex QID for 7 days prescribed.  - emergent precautions discussed- if worsening despite treatment, fever, chills or abscess formation--> report to urgent care.  - followup with PCP 4-5 days.    Reviewed expectations re: course of current medical issues.  Discussed self-management of symptoms.  Outlined signs and symptoms indicating need for more acute intervention.  Patient verbalized understanding and all questions were answered.  Patient received an After-Visit Summary.    No orders of the defined types were placed in this encounter.   Note is dictated utilizing voice recognition software. Although note has been proof read prior to signing, occasional typographical errors still can be  missed. If any questions arise, please do not hesitate to call for verification.   electronically signed by:  Howard Pouch, DO  [place]

## 2018-04-03 ENCOUNTER — Telehealth: Payer: Self-pay | Admitting: Family

## 2018-04-03 LAB — FERRITIN: Ferritin: 36 ng/mL (ref 11–307)

## 2018-04-03 LAB — IRON AND TIBC
Iron: 32 ug/dL — ABNORMAL LOW (ref 41–142)
Saturation Ratios: 11 % — ABNORMAL LOW (ref 21–57)
TIBC: 293 ug/dL (ref 236–444)
UIBC: 260 ug/dL (ref 120–384)

## 2018-04-03 NOTE — Telephone Encounter (Signed)
LMVM for patient regarding appointment date/time per 1/22 sch msg

## 2018-04-09 ENCOUNTER — Ambulatory Visit: Payer: Medicaid Other

## 2018-04-09 ENCOUNTER — Other Ambulatory Visit: Payer: Self-pay

## 2018-04-09 ENCOUNTER — Inpatient Hospital Stay: Payer: Medicaid Other

## 2018-04-09 VITALS — BP 109/54 | HR 67 | Temp 98.8°F | Resp 18

## 2018-04-09 DIAGNOSIS — D500 Iron deficiency anemia secondary to blood loss (chronic): Secondary | ICD-10-CM

## 2018-04-09 DIAGNOSIS — K8020 Calculus of gallbladder without cholecystitis without obstruction: Secondary | ICD-10-CM

## 2018-04-09 DIAGNOSIS — K625 Hemorrhage of anus and rectum: Secondary | ICD-10-CM

## 2018-04-09 MED ORDER — ACETAMINOPHEN 325 MG PO TABS
650.0000 mg | ORAL_TABLET | Freq: Once | ORAL | Status: AC
  Administered 2018-04-09: 650 mg via ORAL

## 2018-04-09 MED ORDER — SODIUM CHLORIDE 0.9 % IV SOLN
510.0000 mg | Freq: Once | INTRAVENOUS | Status: AC
  Administered 2018-04-09: 510 mg via INTRAVENOUS
  Filled 2018-04-09: qty 17

## 2018-04-09 MED ORDER — SODIUM CHLORIDE 0.9 % IV SOLN
Freq: Once | INTRAVENOUS | Status: AC
  Administered 2018-04-09: 14:00:00 via INTRAVENOUS
  Filled 2018-04-09: qty 250

## 2018-04-09 NOTE — Patient Instructions (Signed)

## 2018-04-09 NOTE — Progress Notes (Signed)
Pt stated that since she was here last time she was diagnosed with gallbladder stones. Says she is in pain and requested pain medication. Ross Ludwig, NP notified. Tylenol 650 mg given by Judson Roch'

## 2018-04-10 ENCOUNTER — Telehealth: Payer: Self-pay | Admitting: Family Medicine

## 2018-04-10 ENCOUNTER — Other Ambulatory Visit: Payer: Self-pay | Admitting: Family Medicine

## 2018-04-10 MED ORDER — HYDROCODONE-ACETAMINOPHEN 5-325 MG PO TABS: 1.0000 | ORAL_TABLET | Freq: Three times a day (TID) | ORAL | 0 refills | Status: AC | PRN

## 2018-04-10 NOTE — Telephone Encounter (Signed)
Called her back to discuss.  She reports that she continues to have pain in her right side, which we think is due to her gallbladder.  She is seeing general surgery next week on February 3.  Recent ultrasound in the ER showed stones and sludge but not cholecystitis.  She reports that her pain is not different or worse, this is the same thing she has had since her visit with me earlier this month.  No vomiting, no fever, she is still able to eat  I gave her a few hydrocodone earlier this month and she is requesting more. Advised her that I will give her a small supply, but cautioned her that if her symptoms are worsening or changing she could be developing acute cholecystitis.  If any worsening pain, vomiting, or fever she should go to the ER  She states understanding  She has not heard from GYN yet, will follow up on this referral  03/27/2018  1   03/21/2018  Clonazepam 1 MG Tablet  90.00 30 Je Cop   [id number]   [facility] ([id number])   0  6.00 LME  Medicaid   [place]  03/22/2018  1   03/22/2018  Hydrocodone-Acetamin 5-325 MG  12.00 4 Je Cop   [id number]   [facility] ([id number])   0  15.00 MME  Medicaid   [place]  02/25/2018  1   02/25/2018  Clonazepam 1 MG Tablet  90.00 30 Je Cop   [id number]   [facility] ([id number])   0  6.00 LME  Medicaid   [place]  01/22/2018  1   11/26/2017  Clonazepam 1 MG Tablet  90.00 30 Je Cop   [id number]   [facility] ([id number])   2  6.00 LME  Medicaid   [place]  12/23/2017  1   11/26/2017  Clonazepam 1 MG Tablet  90.00 30 Je Cop   [id number]   [facility] ([id number])   1  6.00 LME  Medicaid   [place]  11/27/2017  1   11/26/2017  Clonazepam 1 MG Tablet  90.00 30 Je Cop   [id number]   [facility] ([id number])   0  6.00 LME  Medicaid      She reports taking 1 Klonopin in the morning, and using the hydrocodone at night

## 2018-04-10 NOTE — Telephone Encounter (Signed)
Copied from [place] [id number]. Topic: Quick Communication - See Telephone Encounter >> Apr 10, 2018  3:09 PM Antonieta Iba C wrote: CRM for notification. See Telephone encounter for: 04/10/18.  Pt called in to request pain medication for her side pain. Pt says that she is being seen for her gallbladder. Pt says that her PCP wrote her a Rx for something for pain but she isnt sure of what the medication is. Pt says that PCP gave her a 15 day supply but she no longer have the bottle to provide the name.   Please advise.  Pharmacy: [facility] [place], [place] [phone] (Phone) [phone] (Fax)

## 2018-04-15 ENCOUNTER — Telehealth: Payer: Self-pay | Admitting: Family Medicine

## 2018-04-15 ENCOUNTER — Other Ambulatory Visit: Payer: Self-pay | Admitting: General Surgery

## 2018-04-15 NOTE — Telephone Encounter (Unsigned)
Copied from [place] [id number]. Topic: General - Other >> Apr 15, 2018  4:58 PM Windy Kalata wrote: Reason for CRM: Jacquline from Dr. Darrel Hoover nurse called asking about the [facility] GYN referral for patient as she has not heard anything, she states that this needs to be HIGH priority as she is being held off for surgery until the mass is evaluated she however is in pain and needs this to be taken care of ASAP  Best call back is [phone]

## 2018-04-17 NOTE — Telephone Encounter (Signed)
Thanks, I took care of it.

## 2018-04-17 NOTE — Telephone Encounter (Signed)
Patient has appt with OBGYN this moth? Please advise if anything needs to be done with this message.

## 2018-04-22 ENCOUNTER — Ambulatory Visit: Payer: Medicaid Other | Admitting: Family Medicine

## 2018-04-22 ENCOUNTER — Ambulatory Visit: Payer: Medicaid Other | Admitting: Family

## 2018-04-23 ENCOUNTER — Encounter (HOSPITAL_BASED_OUTPATIENT_CLINIC_OR_DEPARTMENT_OTHER): Payer: Self-pay | Admitting: *Deleted

## 2018-04-23 ENCOUNTER — Emergency Department (HOSPITAL_BASED_OUTPATIENT_CLINIC_OR_DEPARTMENT_OTHER)
Admission: EM | Admit: 2018-04-23 | Discharge: 2018-04-23 | Disposition: A | Discharge status: Home / Self Care | Payer: Medicaid Other | Attending: Emergency Medicine | Admitting: Emergency Medicine

## 2018-04-23 ENCOUNTER — Other Ambulatory Visit: Payer: Self-pay

## 2018-04-23 DIAGNOSIS — H60333 Swimmer's ear, bilateral: Secondary | ICD-10-CM | POA: Diagnosis not present

## 2018-04-23 DIAGNOSIS — Z79899 Other long term (current) drug therapy: Secondary | ICD-10-CM | POA: Diagnosis not present

## 2018-04-23 DIAGNOSIS — F17210 Nicotine dependence, cigarettes, uncomplicated: Secondary | ICD-10-CM | POA: Diagnosis not present

## 2018-04-23 DIAGNOSIS — H9203 Otalgia, bilateral: Secondary | ICD-10-CM | POA: Diagnosis present

## 2018-04-23 MED ORDER — NEOMYCIN-POLYMYXIN-HC 3.5-10000-1 OT SUSP: 3.0000 [drp] | Freq: Four times a day (QID) | OTIC | 0 refills | Status: DC

## 2018-04-23 NOTE — ED Triage Notes (Signed)
Pain in her ears x 5 days. Drainage from her left ear.

## 2018-04-23 NOTE — ED Provider Notes (Signed)
[place] [facility] EMERGENCY DEPARTMENT Provider Note   CSN: [id number] Arrival date & time: 04/23/18  1531     History   Chief Complaint Chief Complaint  Patient presents with  . Otalgia    HPI Norma Goodman is a 33 y.o. female who presents to ED for bilateral ear pain and drainage for the past 5 days.  Reports having white discharge and pain with palpation of the ear.  No sick contacts with similar symptoms.  She has not tried any medications to help with her symptoms.  Denies any recent swimming, fever, color change.  HPI  Past Medical History:  Diagnosis Date  . Anxiety   . Depression     Patient Active Problem List   Diagnosis Date Noted  . IDA (iron deficiency anemia) 07/09/2017  . Chest pain 06/01/2017  . Cigarette smoker 05/04/2017  . Rectal bleeding 10/19/2014  . Frequent headaches 10/19/2014  . Anxiety state, unspecified 04/24/2012    Past Surgical History:  Procedure Laterality Date  . LEFT HEART CATH AND CORONARY ANGIOGRAPHY N/A 05/08/2017   Procedure: LEFT HEART CATH AND CORONARY ANGIOGRAPHY;  Surgeon: Martinique, Peter M, MD;  Location: [facility];  Service: Cardiovascular;  Laterality: N/A;     OB History   No obstetric history on file.      Home Medications    Prior to Admission medications   Medication Sig Start Date End Date Taking? Authorizing Provider  clonazePAM (KLONOPIN) 1 MG tablet TAKE 1 TABLET EVERY MORNING& ONE-HALF- 1 TABLET AT MIDDAY AS NEEDED& 1 TABLET EVERY EVENING 03/21/18  Yes Copland, Gay Filler, MD  FLUoxetine (PROZAC) 20 MG capsule Take 3 capsules (60 mg total) by mouth daily. 03/21/18  Yes Copland, Gay Filler, MD  omeprazole (PRILOSEC) 40 MG capsule Take 1 capsule (40 mg total) by mouth daily. 03/21/18  Yes Copland, Gay Filler, MD  PROVENTIL HFA 108 (90 Base) MCG/ACT inhaler INHALE 2 PUFFS INTO THE LUNGS EVERY 6 HOURS AS NEEDED FOR WHEEZING OR SHORTNESS OF BREATH 03/27/18  Yes Copland, Gay Filler, MD  cephALEXin (KEFLEX) 500  MG capsule Take 1 capsule (500 mg total) by mouth 4 (four) times daily. 04/02/18   Kuneff, Renee A, DO  chlorhexidine (HIBICLENS) 4 % external liquid Soak with finger in warm water and hibiclens BID. 04/02/18   Kuneff, Renee A, DO  meloxicam (MOBIC) 7.5 MG tablet TAKE 1 TABLET BY MOUTH EVERY DAY AS NEEDED FOR PAIN, DO NOT TAKE WITH OTHER NSAIDS 04/10/18   Copland, Gay Filler, MD  mupirocin nasal ointment (BACTROBAN) 2 % Apply to finger BID until resolved. 04/02/18   Kuneff, Renee A, DO  neomycin-polymyxin-hydrocortisone (CORTISPORIN) 3.5-10000-1 OTIC suspension Place 3 drops into both ears 4 (four) times daily. 04/23/18   Cheila Wickstrom, PA-C  RETIN-A 0.1 % cream Apply 1 application topically at bedtime as needed (acne). 02/25/18   Copland, Gay Filler, MD    Family History Family History  Problem Relation Age of Onset  . Diabetes Father   . Hypertension Father   . Heart disease Father     Social History Social History   Tobacco Use  . Smoking status: Heavy Tobacco Smoker    Packs/day: 1.00    Years: 9.00    Pack years: 9.00    Types: Cigarettes  . Smokeless tobacco: Never Used  Substance Use Topics  . Alcohol use: No    Alcohol/week: 0.0 standard drinks  . Drug use: No     Allergies   Patient has no  known allergies.   Review of Systems Review of Systems  Constitutional: Negative for fever.  HENT: Positive for ear discharge and ear pain.   Skin: Negative for color change.     Physical Exam Updated Vital Signs BP 123/82   Pulse 100   Temp 98.3 F (36.8 C) (Oral)   Resp 18   Ht 5\' 2"  (1.575 m)   Wt 92 kg   SpO2 99%   BMI 37.10 kg/m   Physical Exam Vitals signs and nursing note reviewed.  Constitutional:      General: She is not in acute distress.    Appearance: She is well-developed. She is not diaphoretic.  HENT:     Head: Normocephalic and atraumatic.     Right Ear: Drainage present. Tympanic membrane is perforated.     Left Ear: Drainage present. Tympanic  membrane is not perforated.     Ears:   Eyes:     General: No scleral icterus.    Conjunctiva/sclera: Conjunctivae normal.  Neck:     Musculoskeletal: Normal range of motion.  Pulmonary:     Effort: Pulmonary effort is normal. No respiratory distress.  Skin:    Findings: No rash.  Neurological:     Mental Status: She is alert.      ED Treatments / Results  Labs (all labs ordered are listed, but only abnormal results are displayed) Labs Reviewed - No data to display  EKG None  Radiology No results found.  Procedures Procedures (including critical care time)  Medications Ordered in ED Medications - No data to display   Initial Impression / Assessment and Plan / ED Course  I have reviewed the triage vital signs and the nursing notes.  Pertinent labs & imaging results that were available during my care of the patient were reviewed by me and considered in my medical decision making (see chart for details).     33 year old female presents to ED for bilateral ear pain and drainage for the past 5 days.  On exam there is evidence of perforated TM of the right ear, bilateral otitis externa.  No signs of mastoiditis.  Vital signs are within normal limits.  Will treat with Cortisporin eardrops.  Advised her to return to ED for any severe worsening symptoms.  Patient is hemodynamically stable, in NAD, and able to ambulate in the ED. Evaluation does not show pathology that would require ongoing emergent intervention or inpatient treatment. I explained the diagnosis to the patient. Pain has been managed and has no complaints prior to discharge. Patient is comfortable with above plan and is stable for discharge at this time. All questions were answered prior to disposition. Strict return precautions for returning to the ED were discussed. Encouraged follow up with PCP.    Portions of this note were generated with [employer]. Dictation errors may occur despite best  attempts at proofreading.  Final Clinical Impressions(s) / ED Diagnoses   Final diagnoses:  Acute swimmer's ear of both sides    ED Discharge Orders         Ordered    neomycin-polymyxin-hydrocortisone (CORTISPORIN) 3.5-10000-1 OTIC suspension  4 times daily     04/23/18 1727           Delia Heady, PA-C 04/23/18 1733    Gareth Morgan, MD 04/27/18 [id number]

## 2018-04-23 NOTE — Discharge Instructions (Signed)
Use the eardrops as directed. Return to the ED for worsening symptoms, redness around her ear, swelling, increased discharge.

## 2018-04-25 ENCOUNTER — Encounter: Payer: Medicaid Other | Admitting: Obstetrics & Gynecology

## 2018-05-09 ENCOUNTER — Encounter: Payer: Self-pay | Admitting: Family Medicine

## 2018-05-09 ENCOUNTER — Ambulatory Visit (INDEPENDENT_AMBULATORY_CARE_PROVIDER_SITE_OTHER): Payer: Medicaid Other | Admitting: Family Medicine

## 2018-05-09 VITALS — BP 100/65 | HR 83 | Ht 63.0 in | Wt 192.0 lb

## 2018-05-09 DIAGNOSIS — Z3202 Encounter for pregnancy test, result negative: Secondary | ICD-10-CM

## 2018-05-09 DIAGNOSIS — N939 Abnormal uterine and vaginal bleeding, unspecified: Secondary | ICD-10-CM | POA: Diagnosis not present

## 2018-05-09 DIAGNOSIS — L732 Hidradenitis suppurativa: Secondary | ICD-10-CM

## 2018-05-09 DIAGNOSIS — N83201 Unspecified ovarian cyst, right side: Secondary | ICD-10-CM

## 2018-05-09 DIAGNOSIS — Z30430 Encounter for insertion of intrauterine contraceptive device: Secondary | ICD-10-CM

## 2018-05-09 DIAGNOSIS — Z01812 Encounter for preprocedural laboratory examination: Secondary | ICD-10-CM

## 2018-05-09 LAB — POCT URINE PREGNANCY: Preg Test, Ur: NEGATIVE

## 2018-05-09 MED ORDER — LEVONORGESTREL 19.5 MCG/DAY IU IUD
INTRAUTERINE_SYSTEM | Freq: Once | INTRAUTERINE | Status: AC
  Administered 2018-05-09: 1 via INTRAUTERINE

## 2018-05-09 MED ORDER — CLINDAMYCIN PHOSPHATE 1 % EX GEL: Freq: Two times a day (BID) | CUTANEOUS | 11 refills | Status: DC

## 2018-05-09 NOTE — Patient Instructions (Signed)

## 2018-05-09 NOTE — Progress Notes (Signed)
IUD Procedure Note Patient identified, informed consent performed, signed copy in chart, time out was performed.  Urine pregnancy test negative.  Speculum placed in the vagina.  Cervix visualized.  Cleaned with Betadine x 2.  Grasped anteriorly with a single tooth tenaculum.  Uterus sounded to 8 cm.  Liletta  IUD placed per manufacturer's recommendations.  Strings trimmed to 3 cm. Tenaculum was removed, good hemostasis noted.  Patient tolerated procedure well.   Patient given post procedure instructions and Liletta care card with expiration date.  Patient is asked to check IUD strings periodically and follow up in 4-6 weeks for IUD check.  

## 2018-05-09 NOTE — Progress Notes (Signed)
Patient has irregular and heavy periods. Patient states the ED noted she has an ovarian lesion.  Patient Kathrene Alu RN

## 2018-05-09 NOTE — Progress Notes (Signed)
   Subjective:    Patient ID: Norma Goodman, female    DOB: 11-21-1985, 33 y.o.   MRN: [id number]  HPI 33 year old G1, P1 who was referred to our office for menorrhagia with regular cycles.  Additionally, she was seen in the emergency department and for right upper quadrant abdominal pain, found to have symptomatic cholelithiasis.  CT scan was done, which showed an incidental right ovarian cyst that was not well characterized -possibly solid.  Patient's periods and interval of approximately 21 days, last for 10 days with heavy bleeding, cramping, clots.  Her bleeding is so heavy that she often becomes anemic and she has needed to use Feraheme injections in order to improve her anemia.  Additionally, the patient develops cystic lesions on her inner thighs and labia.  I have reviewed the patients past medical, family, and social history.  I have reviewed the patient's medication list and allergies.   Review of Systems     Objective:   Physical Exam Exam conducted with a chaperone present.  Constitutional:      Appearance: Normal appearance.  HENT:     Head: Normocephalic and atraumatic.  Cardiovascular:     Rate and Rhythm: Normal rate and regular rhythm.     Pulses: Normal pulses.  Pulmonary:     Effort: Pulmonary effort is normal.     Breath sounds: Normal breath sounds.  Abdominal:     Hernia: There is no hernia in the right inguinal area or left inguinal area.  Genitourinary:    Labia:        Right: No rash, tenderness or lesion.        Left: No rash, tenderness or lesion.      Urethra: No prolapse.     Vagina: No signs of injury and foreign body. No vaginal discharge, erythema, tenderness or bleeding.     Cervix: No cervical motion tenderness, discharge, friability, lesion, erythema or cervical bleeding.     Comments: Grade 1 hidradenitis on inner thighs bilaterally. Lymphadenopathy:     Lower Body: No right inguinal adenopathy. No left inguinal adenopathy.  Neurological:       Mental Status: She is alert.       Assessment & Plan:  1. Abnormal uterine bleeding (AUB) Discussed potential ways to control the patient's bleeding, including medical versus surgical.  Discussed options of using IUD versus progesterone only pills (patient is a smoker and 33 years old). Additionally, discussed surgical foods of controlling abnormal uterine bleeding including ablation versus hysterectomy.  Patient would prefer to trial IUD initially.  2. Cyst of right ovary We will get ultrasound to characterize patient cyst - US PELVIS TRANSVANGINAL NON-OB (TV ONLY); Future  3. Pre-procedure lab exam - POCT urine pregnancy  4. Vulval hidradenitis suppurativa Clindamycin gel to be

## 2018-05-13 ENCOUNTER — Encounter (HOSPITAL_BASED_OUTPATIENT_CLINIC_OR_DEPARTMENT_OTHER): Payer: Self-pay

## 2018-05-13 ENCOUNTER — Ambulatory Visit (HOSPITAL_BASED_OUTPATIENT_CLINIC_OR_DEPARTMENT_OTHER)
Admission: RE | Admit: 2018-05-13 | Discharge: 2018-05-13 | Disposition: A | Discharge status: Home / Self Care | Payer: Medicaid Other | Source: Ambulatory Visit | Attending: Family Medicine | Admitting: Family Medicine

## 2018-05-13 DIAGNOSIS — N83201 Unspecified ovarian cyst, right side: Secondary | ICD-10-CM | POA: Insufficient documentation

## 2018-05-13 HISTORY — DX: Calculus of gallbladder without cholecystitis without obstruction: K80.20

## 2018-05-13 HISTORY — DX: Anemia, unspecified: D64.9

## 2018-05-13 HISTORY — DX: Personal history of other diseases of the digestive system: Z87.19

## 2018-05-14 ENCOUNTER — Other Ambulatory Visit: Payer: Self-pay | Admitting: Family Medicine

## 2018-05-14 DIAGNOSIS — F411 Generalized anxiety disorder: Secondary | ICD-10-CM

## 2018-05-16 NOTE — Telephone Encounter (Signed)
Pt seen recently However it turns out we do not accept her insurance so she is finding a new provider who does   04/23/2018  1   03/21/2018  Clonazepam 1 MG Tablet  90.00 30 Je Cop   [id number]   [facility] ([id number])   1  6.00 LME  Medicaid   [place]  04/15/2018  1   04/15/2018  Hydrocodone-Acetamin 5-325 MG  15.00 5 Ha Ing   [id number]   [facility] ([id number])   0  15.00 MME  Medicaid   [place]  04/10/2018  1   04/10/2018  Hydrocodone-Acetamin 5-325 MG  8.00 2 Je Cop   [id number]   [facility] ([id number])   0  20.00 MME  Medicaid   [place]  03/27/2018  1   03/21/2018  Clonazepam 1 MG Tablet  90.00 30 Je Cop   [id number]   [facility] ([id number])   0  6.00 LME  Medicaid   [place]  03/22/2018  1   03/22/2018  Hydrocodone-Acetamin 5-325 MG  12.00 4 Je Cop   [id number]   [facility] ([id number])   0  15.00 MME  Medicaid   [place]  02/25/2018  1   02/25/2018  Clonazepam 1 MG Tablet  90.00 30 Je Cop   [id number]   [facility] ([id number])   0  6.00 LME  Medicaid   [place]  01/22/2018  1   11/26/2017  Clonazepam 1 MG Tablet  90.00 30 Je Cop   [id number]   [facility] ([id number])   2  6.00 LME  Medicaid   [place]  12/23/2017  1   11/26/2017  Clonazepam 1 MG Tablet  90.00 30 Je Cop   [id number]   [facility] ([id number])   1  6.00 LME  Medicaid   [place]  11/27/2017  1   11/26/2017  Clonazepam 1 MG Tablet  90.00 30 Je Cop   [id number]   [facility] ([id number])   0  6.00 LME  Medicaid   [place]  10/27/2017  1   10/01/2017  Clonazepam 1 MG Tablet  90.00 30 Je Cop   [id number]   [facility] ([id number])   1  6.00 LME  Medicaid   [place]

## 2018-06-06 ENCOUNTER — Ambulatory Visit: Payer: Medicaid Other | Admitting: Family Medicine

## 2018-06-29 ENCOUNTER — Other Ambulatory Visit: Payer: Self-pay | Admitting: Family Medicine

## 2018-06-29 DIAGNOSIS — F411 Generalized anxiety disorder: Secondary | ICD-10-CM

## 2018-07-01 ENCOUNTER — Inpatient Hospital Stay: Payer: Medicaid Other | Admitting: Family

## 2018-07-01 ENCOUNTER — Other Ambulatory Visit: Payer: Self-pay | Admitting: Family Medicine

## 2018-07-01 ENCOUNTER — Inpatient Hospital Stay: Payer: Medicaid Other | Attending: Family

## 2018-07-01 DIAGNOSIS — F411 Generalized anxiety disorder: Secondary | ICD-10-CM

## 2018-07-02 MED ORDER — CLONAZEPAM 1 MG PO TABS: ORAL_TABLET | ORAL | 0 refills | Status: AC

## 2018-07-27 ENCOUNTER — Other Ambulatory Visit: Payer: Self-pay | Admitting: Family Medicine

## 2018-07-27 DIAGNOSIS — F411 Generalized anxiety disorder: Secondary | ICD-10-CM

## 2018-07-29 ENCOUNTER — Telehealth: Payer: Self-pay | Admitting: Family Medicine

## 2018-07-29 NOTE — Telephone Encounter (Signed)
Copied from [place] [id number]. Topic: Quick Communication - Rx Refill/Question >> Jul 29, 2018  4:19 PM Norma Goodman wrote: Medication:  omeprazole (PRILOSEC) 40 MG capsule clonazePAM (KLONOPIN) 1 MG tablet FLUoxetine (PROZAC) 20 MG capsule  Has the patient contacted their pharmacy? Yes - states that the pharmacy won't refill for her (Agent: If no, request that the patient contact the pharmacy for the refill.) (Agent: If yes, when and what did the pharmacy advise?)  Preferred Pharmacy (with phone number or street name): [facility] [place], [place] - [place] [phone] (Phone) [phone] (Fax)  Agent: Please be advised that RX refills may take up to 3 business days. We ask that you follow-up with your pharmacy.

## 2018-07-30 ENCOUNTER — Other Ambulatory Visit: Payer: Self-pay | Admitting: Family Medicine

## 2018-07-30 DIAGNOSIS — F411 Generalized anxiety disorder: Secondary | ICD-10-CM

## 2018-07-30 NOTE — Telephone Encounter (Signed)
Patient no longer at our practice.

## 2019-04-05 IMAGING — DX DG CHEST 2V
2 series · 2 of 2 positions shown · non-contrast
Comparison: Chest radiograph and chest CT February 16, 2017

CLINICAL DATA: Cough and chest pressure

EXAM:
CHEST  2 VIEW

[chest pa]
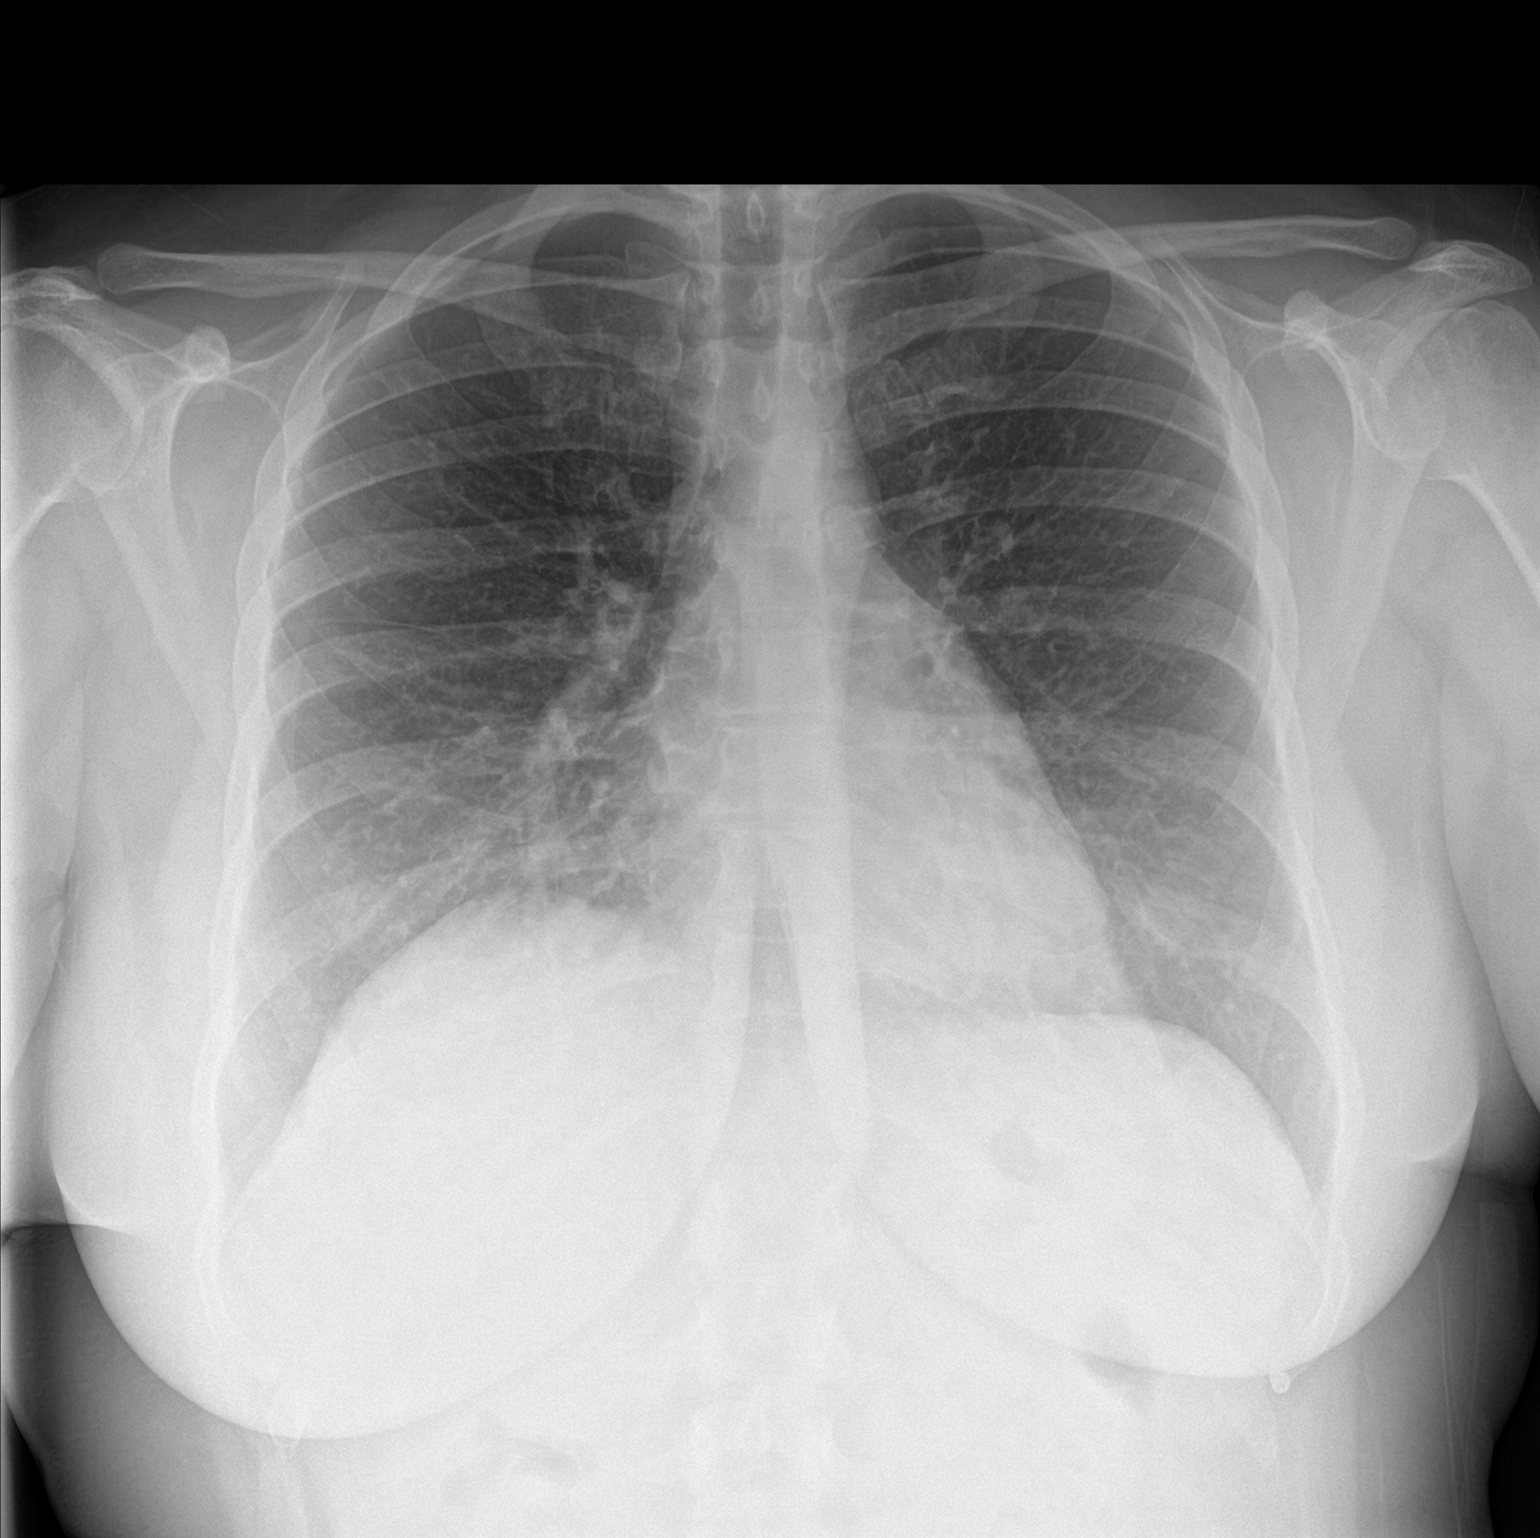

[chest lat]
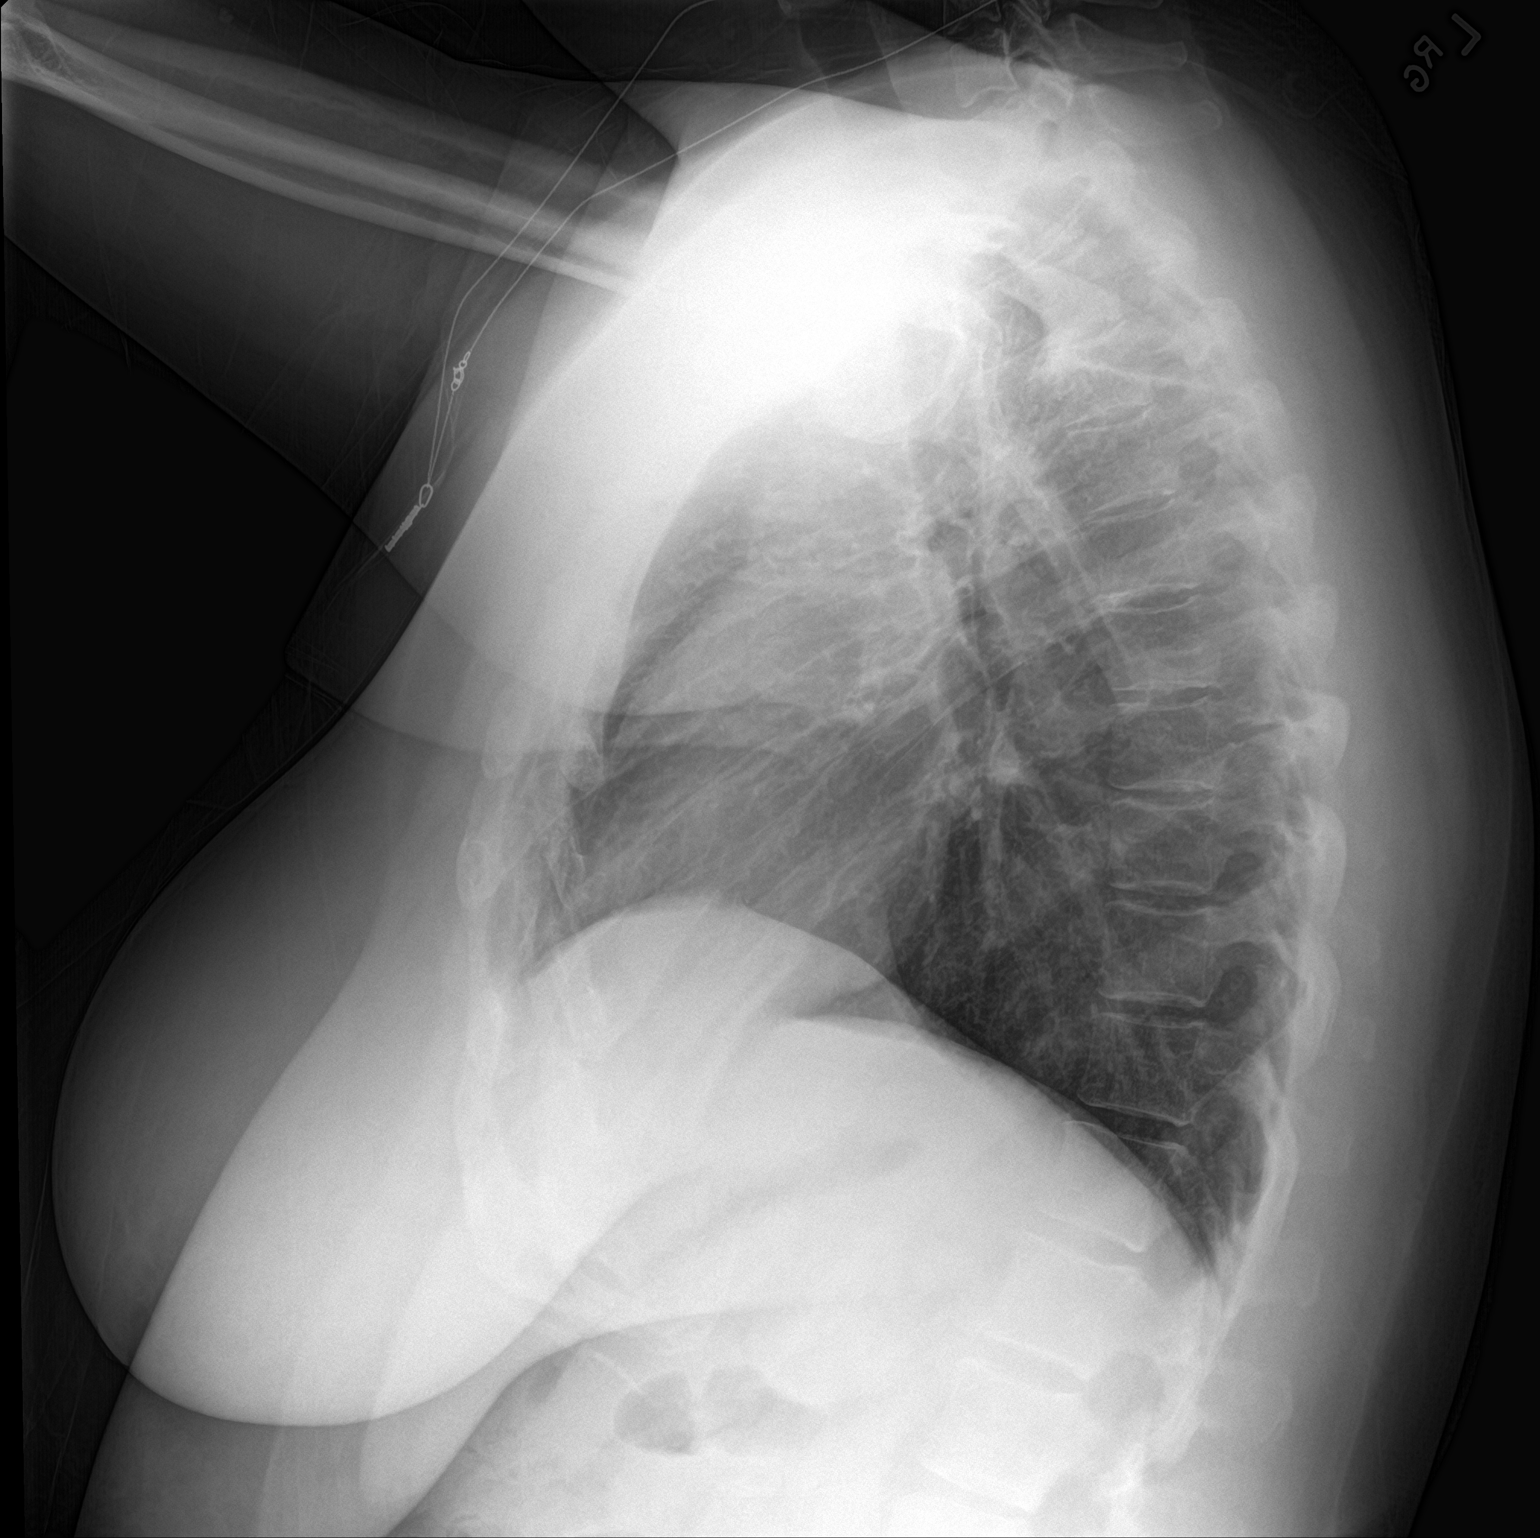

[2 of 2 positions shown; findings below may reference images not displayed]

FINDINGS: Lungs are clear. Heart size and pulmonary vascularity are normal. No
adenopathy. No pneumothorax. No evident bone lesions.
IMPRESSION: No edema or consolidation.

## 2019-10-08 ENCOUNTER — Ambulatory Visit (HOSPITAL_COMMUNITY): Admission: RE | Admit: 2019-10-08 | Payer: Medicaid Other | Source: Ambulatory Visit

## 2019-10-08 ENCOUNTER — Other Ambulatory Visit: Payer: Self-pay | Admitting: Internal Medicine

## 2019-10-08 ENCOUNTER — Other Ambulatory Visit (HOSPITAL_COMMUNITY): Payer: Self-pay | Admitting: Internal Medicine

## 2019-10-08 DIAGNOSIS — R1011 Right upper quadrant pain: Secondary | ICD-10-CM

## 2019-12-10 ENCOUNTER — Other Ambulatory Visit: Payer: Self-pay | Admitting: Family Medicine

## 2019-12-10 DIAGNOSIS — F411 Generalized anxiety disorder: Secondary | ICD-10-CM

## 2020-02-23 IMAGING — CT CT RENAL STONE PROTOCOL
2 of 4 series · 16 of 46 positions shown, 18 images · non-contrast
Comparison: Ultrasound right upper quadrant 03/21/2018. CT chest
02/16/2017

CLINICAL DATA: Right upper quadrant pain for 10 days.
Cholelithiasis on ultrasound. Hematuria.

EXAM:
CT ABDOMEN AND PELVIS WITHOUT CONTRAST
TECHNIQUE: Multidetector CT imaging of the abdomen and pelvis was performed
following the standard protocol without IV contrast.

[Series 2: axial st · axial · 0.81mm/px · z∈[-469,-39]mm · 13 of 96 slices shown, 15 images]
[im 5/96  soft-tissue]
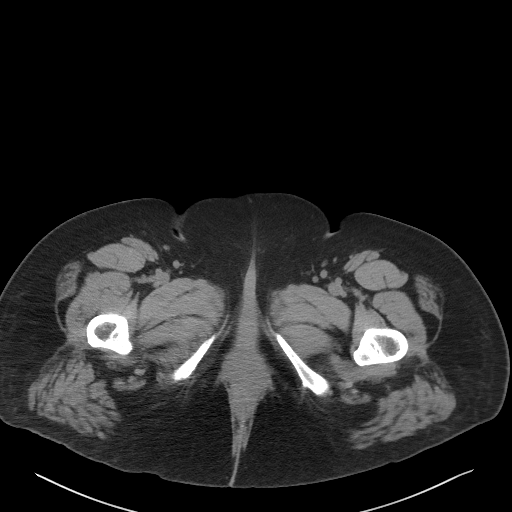
[im 5/96  bone]
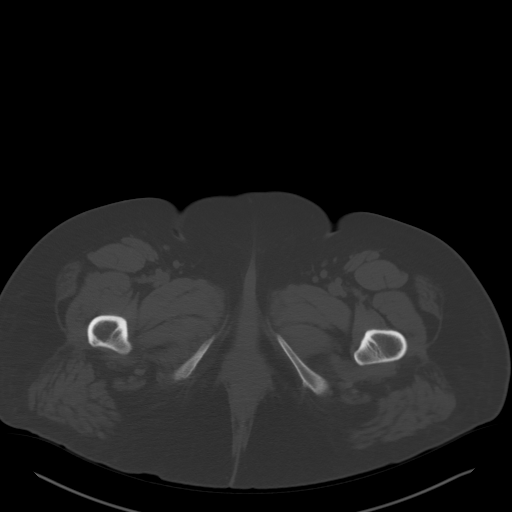
[im 13/96  soft-tissue]
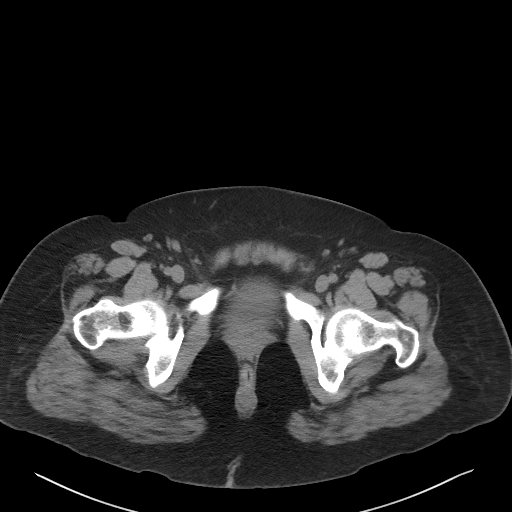
[im 22/96  soft-tissue]
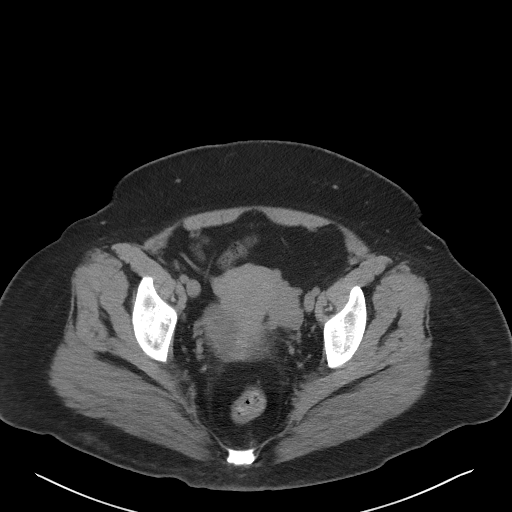
[im 26/96  soft-tissue]
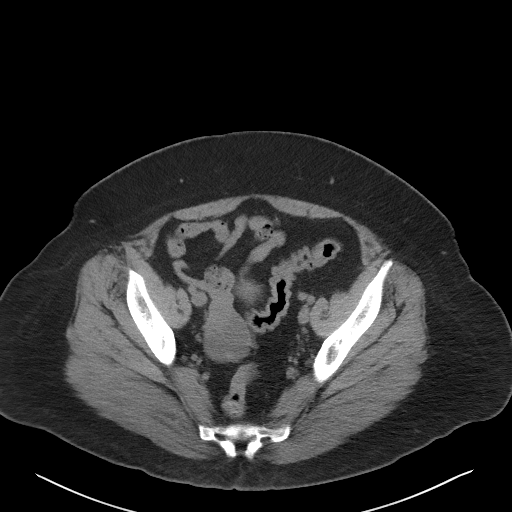
[im 35/96  soft-tissue]
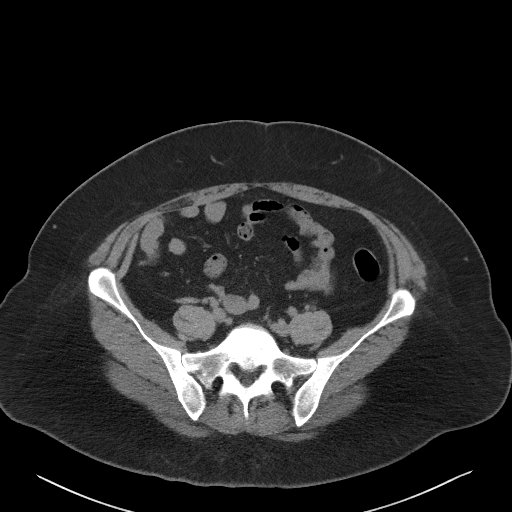
[im 39/96  soft-tissue]
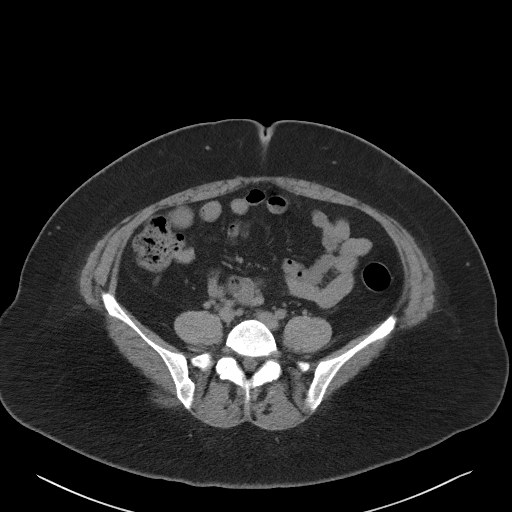
[im 48/96  soft-tissue]
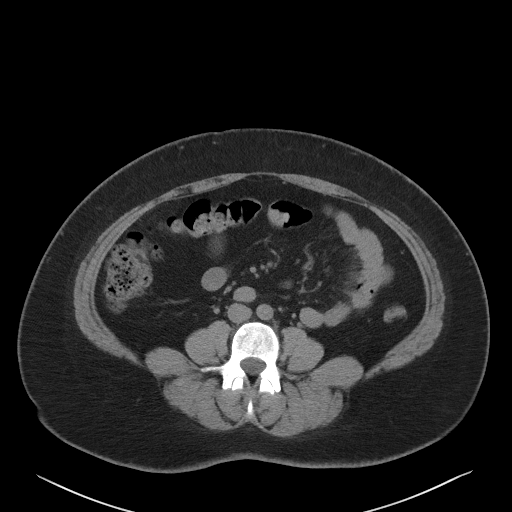
[im 57/96  soft-tissue]
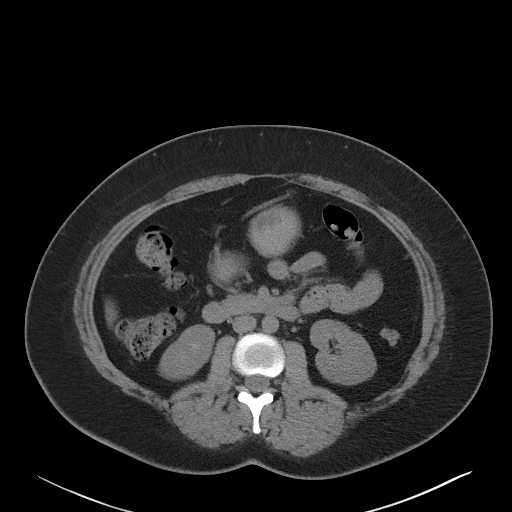
[im 61/96  soft-tissue]
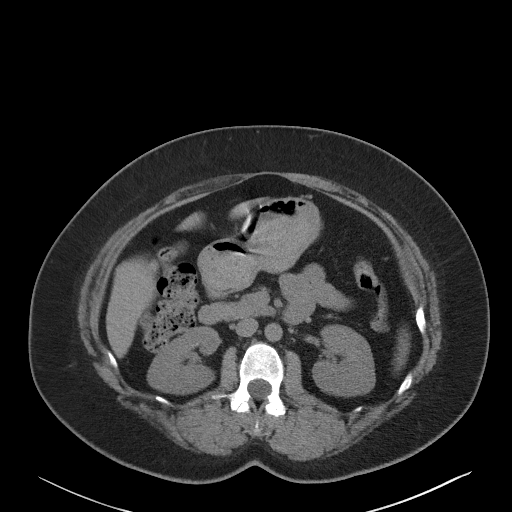
[im 61/96  bone]
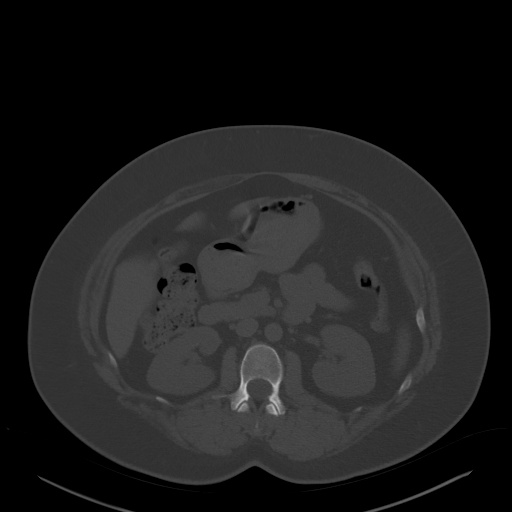
[im 70/96  soft-tissue]
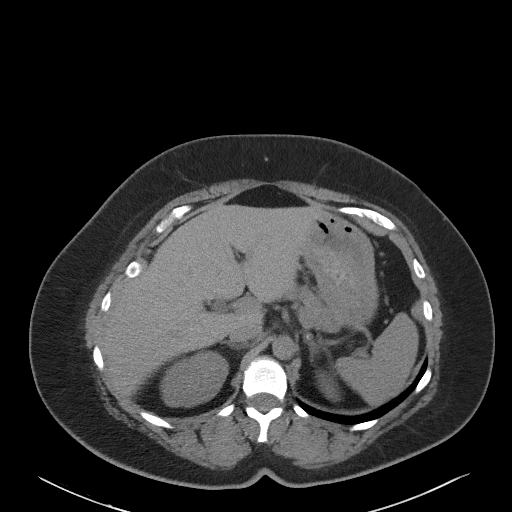
[im 74/96  soft-tissue]
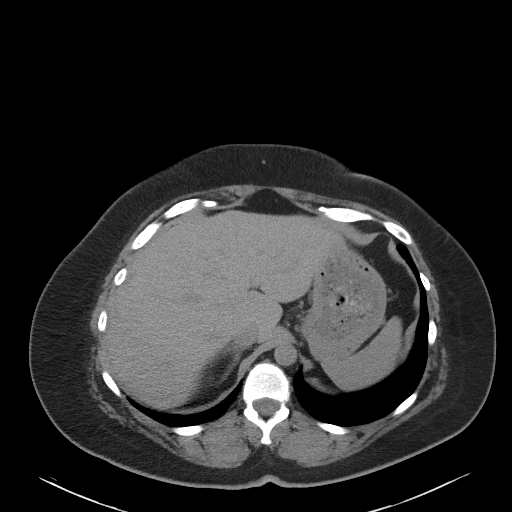
[im 83/96  soft-tissue]
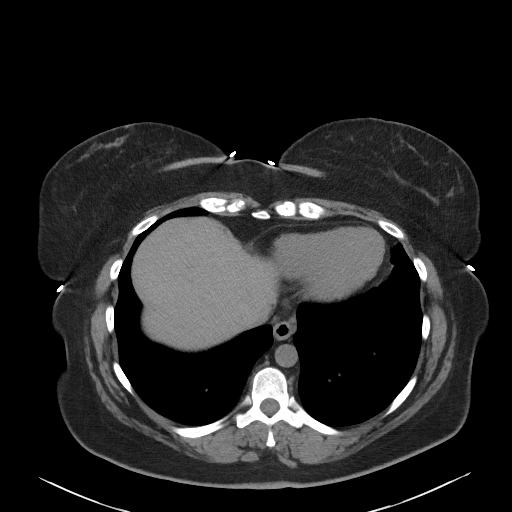
[im 91/96  soft-tissue]
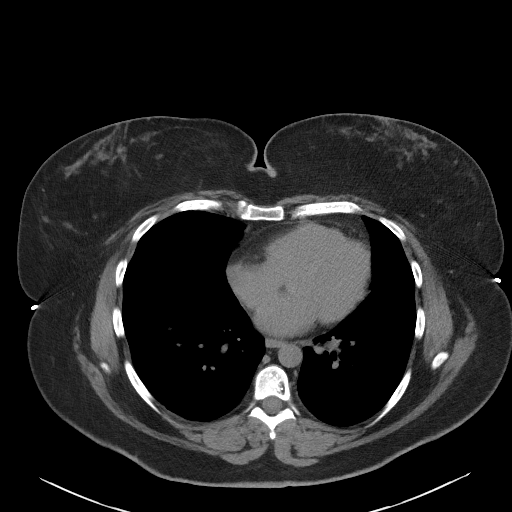

[Series 3: coronal st · coronal · 0.79mm/px · 3 of 77 slices shown]
[im 26/77  soft-tissue]
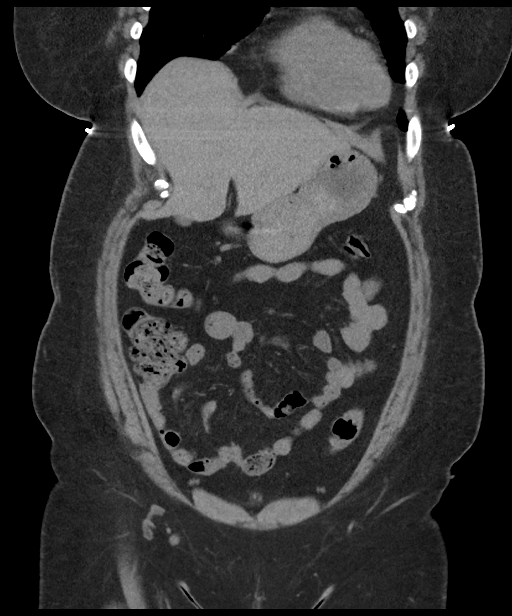
[im 34/77  soft-tissue]
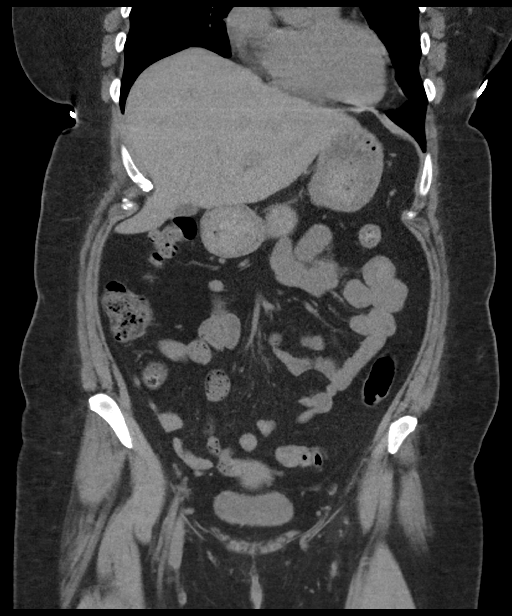
[im 43/77  soft-tissue]
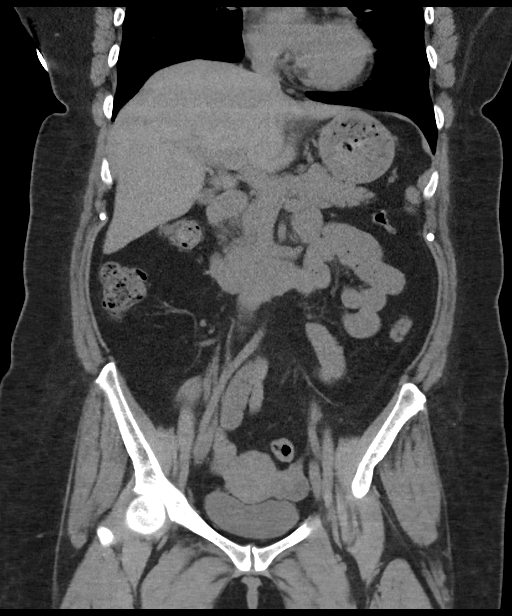

[16 of 46 positions shown; findings below may reference images not displayed]

FINDINGS: Lower chest: Focal small patchy areas of infiltration or atelectasis
in the lung bases. Small esophageal hiatal hernia.

Hepatobiliary: No focal liver abnormality is seen. No gallstones,
gallbladder wall thickening, or biliary dilatation.

Pancreas: Unremarkable. No pancreatic ductal dilatation or
surrounding inflammatory changes.

Spleen: Normal in size without focal abnormality.

Adrenals/Urinary Tract: Adrenal glands are unremarkable. Kidneys are
normal, without renal calculi, focal lesion, or hydronephrosis.
Bladder is unremarkable.

Stomach/Bowel: Stomach, small bowel, and colon are not abnormally
distended. Stool fills the colon. No wall thickening or inflammatory
changes appreciated. Scattered diverticula in the colon. Appendix is
normal.

Vascular/Lymphatic: No significant vascular findings are present. No
enlarged abdominal or pelvic lymph nodes.

Reproductive: Enlarged right ovary measuring 5.8 x 4.1 cm. Probably
represents cystic change but can't exclude solid mass or torsion.
Ultrasound pelvis recommended for further evaluation. Left ovary and
uterus are unremarkable.

Other: No free air or free fluid in the abdomen. Abdominal wall
musculature appears intact.

Musculoskeletal: No acute or significant osseous findings.
IMPRESSION: Enlarged right ovary measuring 5.8 x 4.1 cm. This may represent
cystic change but Can't exclude solid mass or torsion. Ultrasound
pelvis recommended for further evaluation. No evidence of bowel
obstruction or inflammation. Appendix is normal.

## 2020-04-30 IMAGING — US US ABDOMEN LIMITED
1 series · 14 of 25 positions shown · non-contrast
Comparison: None.

CLINICAL DATA: Right upper quadrant pain for 10 days. Elevated
white cell count. Patient ate a fatty meal about 3 hours ago.

EXAM:
ULTRASOUND ABDOMEN LIMITED RIGHT UPPER QUADRANT

[Series 1: us abdomen limited · 0.16mm/px · 14 of 40 slices shown]
[im 1/40]
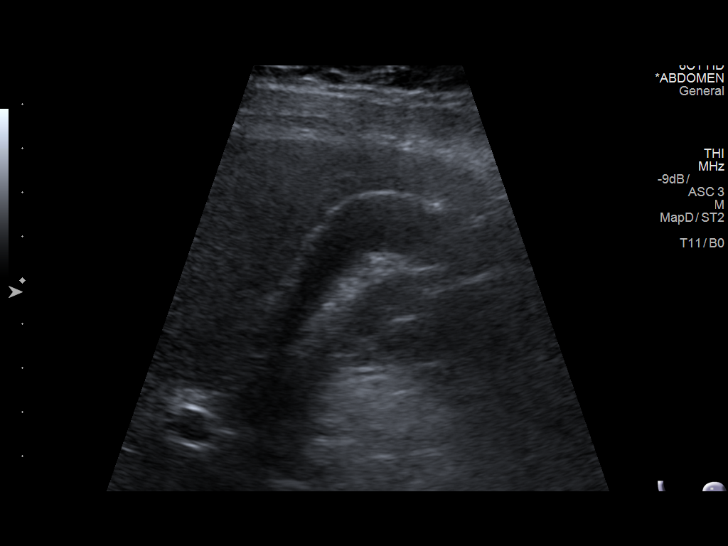
[im 4/40]
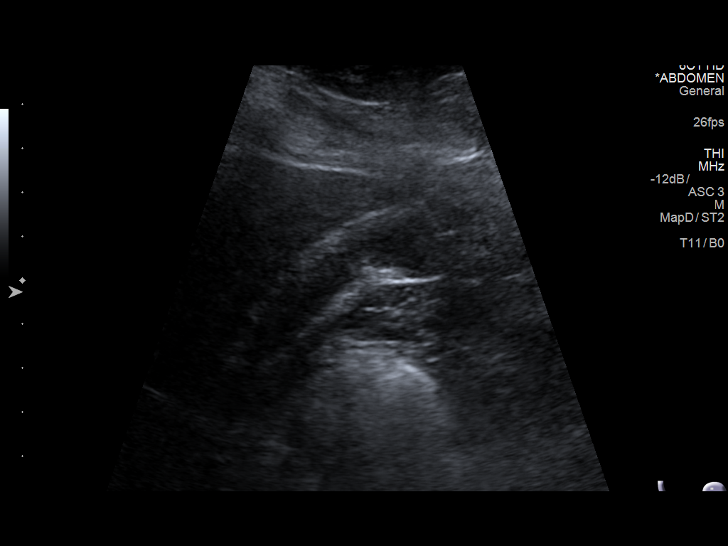
[im 7/40]
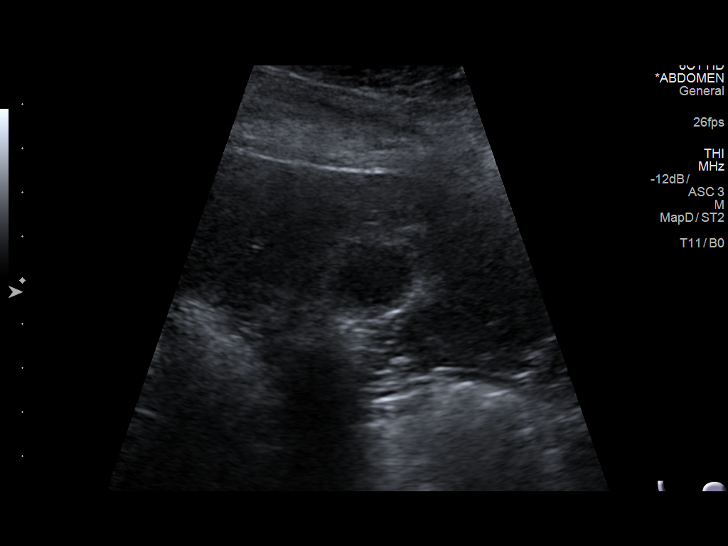
[im 10/40]
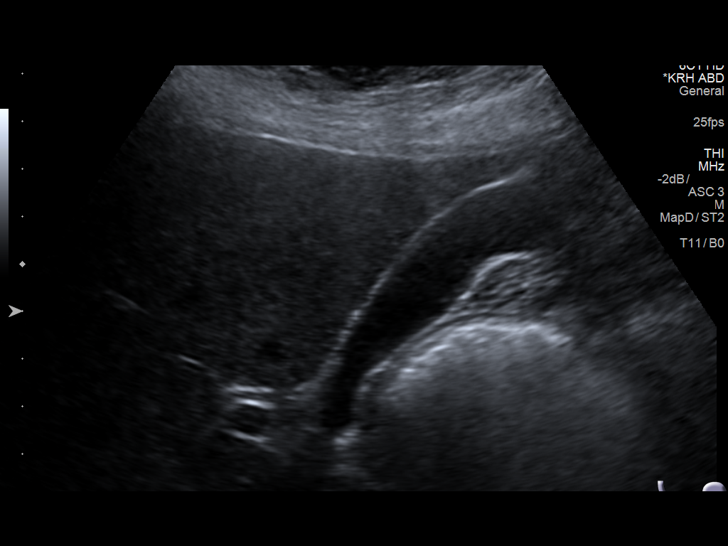
[im 14/40]
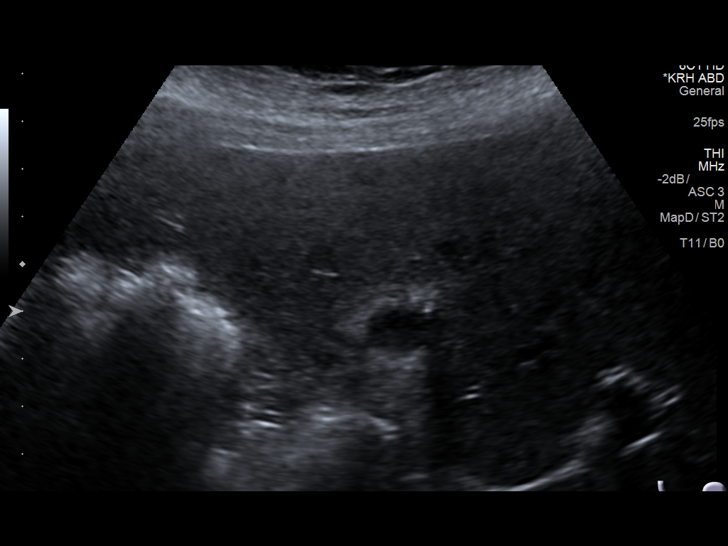
[im 15/40]
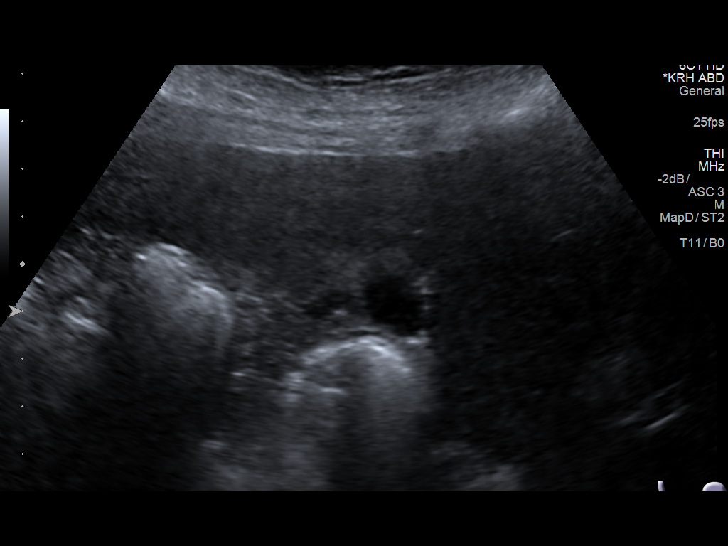
[im 18/40]
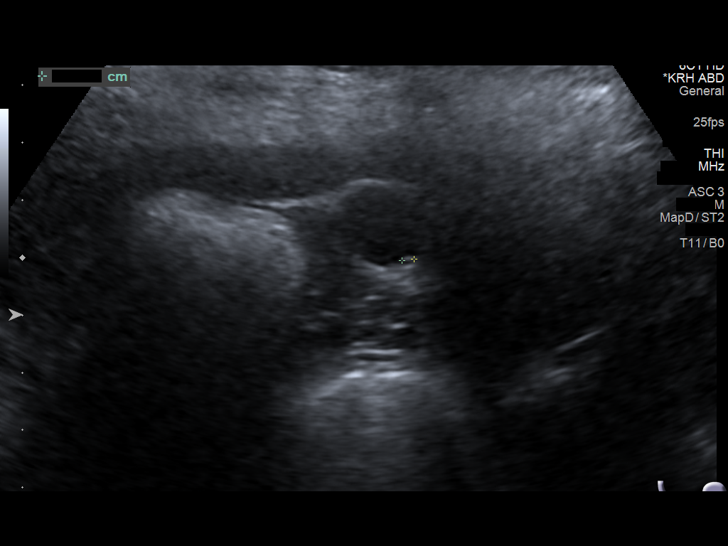
[im 22/40]
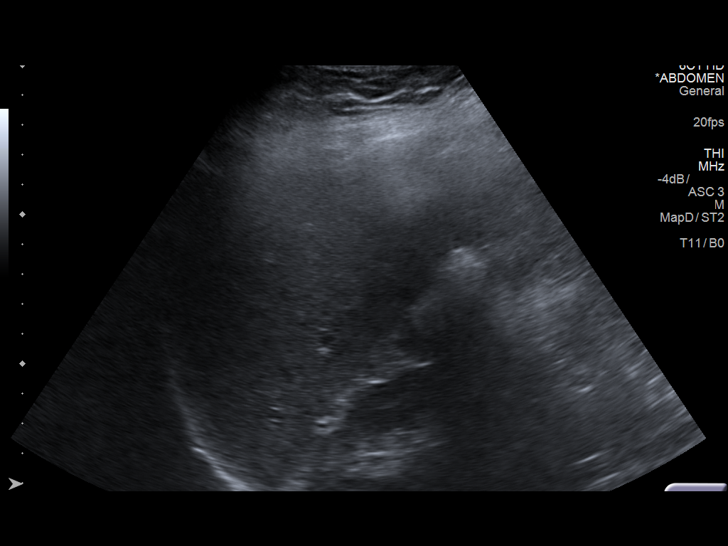
[im 25/40]
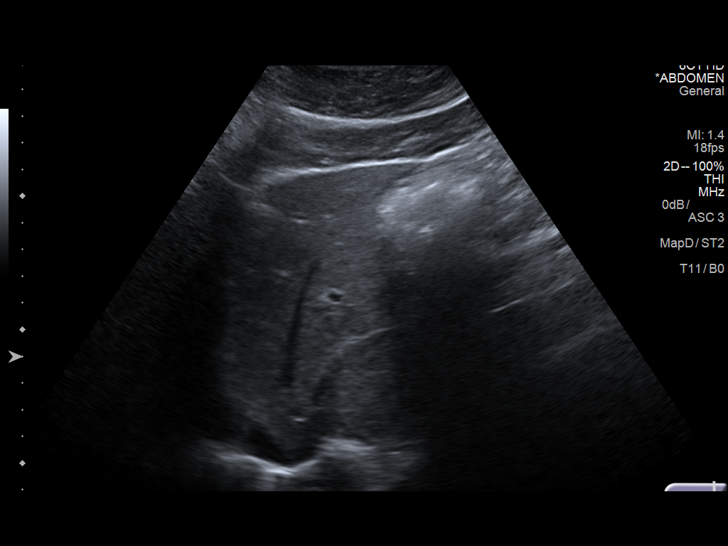
[im 27/40]
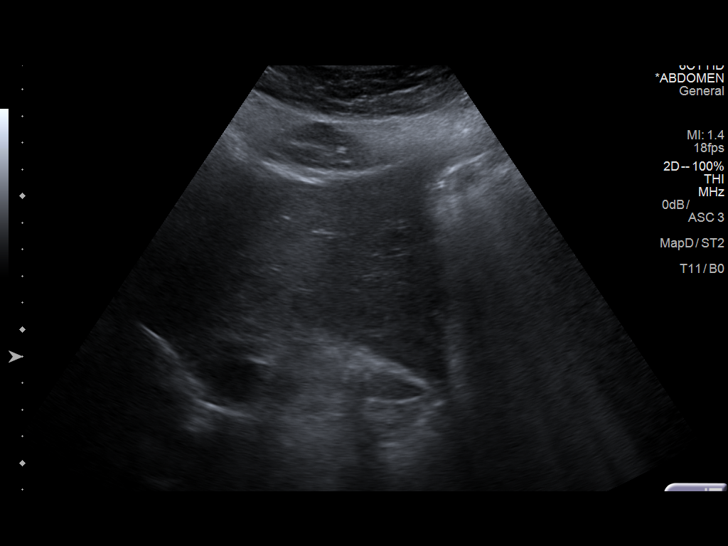
[im 30/40]
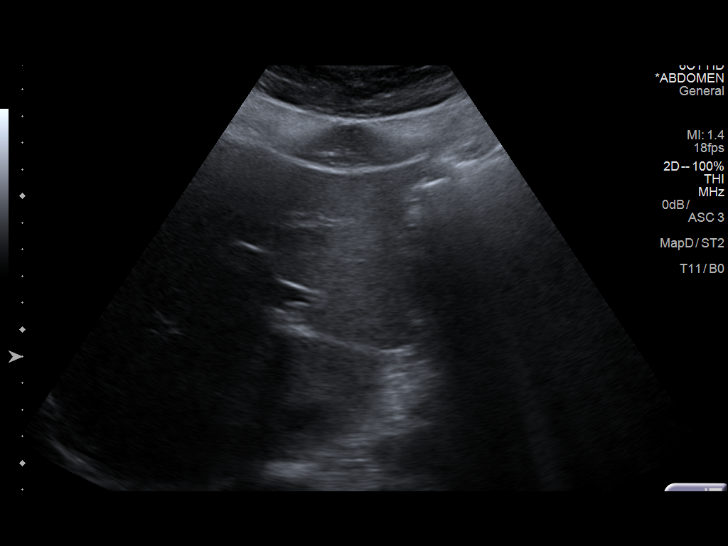
[im 33/40]
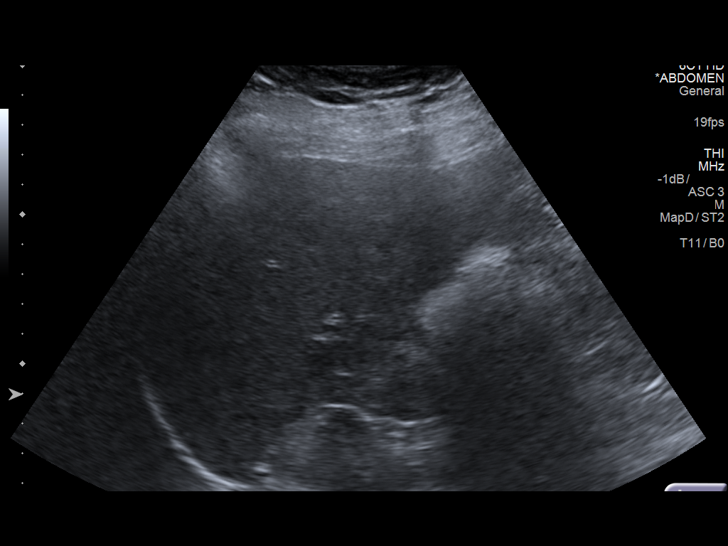
[im 36/40]
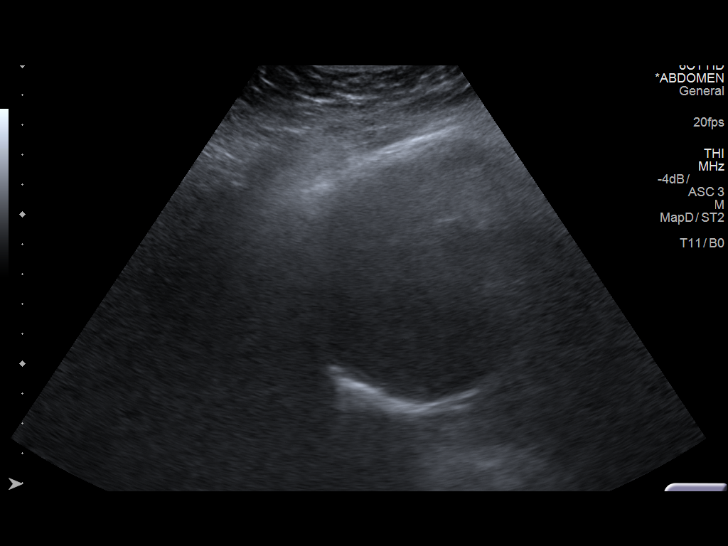
[im 40/40]
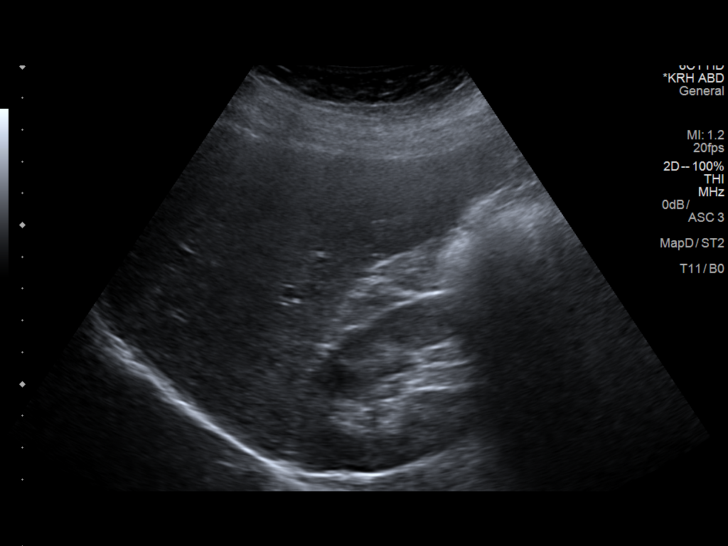

[14 of 25 positions shown; findings below may reference images not displayed]

FINDINGS: Gallbladder:

Small stones seen in the gallbladder measuring 2 mm. Mild sludge
layering in the dependent gallbladder. No gallbladder wall
thickening or edema. Murphy's sign is negative.

Common bile duct:

Diameter: 4 mm, normal

Liver:

No focal lesion identified. Within normal limits in parenchymal
echogenicity. Portal vein is patent on color Doppler imaging with
normal direction of blood flow towards the liver.
IMPRESSION: Cholelithiasis and mild gallbladder sludge. No additional changes to
suggest acute cholecystitis.

## 2020-10-27 ENCOUNTER — Encounter: Payer: Self-pay | Admitting: Physician Assistant

## 2020-10-27 ENCOUNTER — Other Ambulatory Visit: Payer: Self-pay

## 2020-10-27 ENCOUNTER — Ambulatory Visit (INDEPENDENT_AMBULATORY_CARE_PROVIDER_SITE_OTHER): Payer: Medicaid Other | Admitting: Physician Assistant

## 2020-10-27 VITALS — BP 126/90 | HR 90 | Temp 98.1°F | Ht 62.0 in | Wt 169.0 lb

## 2020-10-27 DIAGNOSIS — Z7689 Persons encountering health services in other specified circumstances: Secondary | ICD-10-CM

## 2020-10-27 NOTE — Progress Notes (Signed)
Patient presented for new patient appointment today  Per chart review, she was dismissed from [facility] [id number]  Per [employer] policy patient is unable to establish in our office  Luisa Dago, practice administrator was informed of this and spoke with patient and explained policy. Patient was given a list of other PCP options in the area and recommended to contact her insurance to find an alternate provider. Patient verbalized understanding and left office. She was also given another copy of her dismissal letter from 2019.  Patient completed PHQ-9 today, and is currently no suicidal or homicidal  I did not see patient.  Inda Coke PA-C

## 2021-02-24 ENCOUNTER — Other Ambulatory Visit: Payer: Self-pay | Admitting: Family Medicine

## 2021-02-24 DIAGNOSIS — F411 Generalized anxiety disorder: Secondary | ICD-10-CM

## 2021-07-05 ENCOUNTER — Encounter (HOSPITAL_BASED_OUTPATIENT_CLINIC_OR_DEPARTMENT_OTHER): Payer: Self-pay

## 2021-07-05 ENCOUNTER — Other Ambulatory Visit: Payer: Self-pay

## 2021-07-05 ENCOUNTER — Emergency Department (HOSPITAL_BASED_OUTPATIENT_CLINIC_OR_DEPARTMENT_OTHER)
Admission: EM | Admit: 2021-07-05 | Discharge: 2021-07-05 | Discharge status: Against Medical Advice | Payer: Medicaid Other | Attending: Emergency Medicine | Admitting: Emergency Medicine

## 2021-07-05 DIAGNOSIS — Z5321 Procedure and treatment not carried out due to patient leaving prior to being seen by health care provider: Secondary | ICD-10-CM | POA: Diagnosis not present

## 2021-07-05 DIAGNOSIS — R1310 Dysphagia, unspecified: Secondary | ICD-10-CM | POA: Diagnosis not present

## 2021-07-05 DIAGNOSIS — R590 Localized enlarged lymph nodes: Secondary | ICD-10-CM | POA: Insufficient documentation

## 2021-07-05 DIAGNOSIS — M5431 Sciatica, right side: Secondary | ICD-10-CM | POA: Diagnosis not present

## 2021-07-05 DIAGNOSIS — K0889 Other specified disorders of teeth and supporting structures: Secondary | ICD-10-CM | POA: Diagnosis present

## 2021-07-05 NOTE — ED Triage Notes (Signed)
Mouth/dental pain x 1 week, gums are swollen and pt describes some pain in lymph nodes in neck.  Reports difficulty swallowing. ? ?Pt also reports sciatica type pain down rt. Leg x 2 weeks ?

## 2022-10-20 ENCOUNTER — Emergency Department (HOSPITAL_BASED_OUTPATIENT_CLINIC_OR_DEPARTMENT_OTHER): Admission: EM | Admit: 2022-10-20 | Payer: Medicaid Other | Source: Home / Self Care

## 2022-10-20 ENCOUNTER — Encounter (HOSPITAL_BASED_OUTPATIENT_CLINIC_OR_DEPARTMENT_OTHER): Payer: Self-pay | Admitting: Emergency Medicine

## 2022-10-20 ENCOUNTER — Other Ambulatory Visit: Payer: Self-pay

## 2022-10-20 ENCOUNTER — Emergency Department (HOSPITAL_BASED_OUTPATIENT_CLINIC_OR_DEPARTMENT_OTHER): Payer: Medicaid Other

## 2022-10-20 DIAGNOSIS — R197 Diarrhea, unspecified: Secondary | ICD-10-CM | POA: Diagnosis not present

## 2022-10-20 DIAGNOSIS — R1011 Right upper quadrant pain: Secondary | ICD-10-CM | POA: Diagnosis present

## 2022-10-20 DIAGNOSIS — R110 Nausea: Secondary | ICD-10-CM | POA: Insufficient documentation

## 2022-10-20 DIAGNOSIS — R109 Unspecified abdominal pain: Secondary | ICD-10-CM

## 2022-10-20 LAB — COMPREHENSIVE METABOLIC PANEL
ALT: 12 U/L (ref 0–44)
AST: 11 U/L — ABNORMAL LOW (ref 15–41)
Albumin: 4.6 g/dL (ref 3.5–5.0)
Alkaline Phosphatase: 71 U/L (ref 38–126)
Anion gap: 13 (ref 5–15)
BUN: 14 mg/dL (ref 6–20)
CO2: 19 mmol/L — ABNORMAL LOW (ref 22–32)
Calcium: 9.4 mg/dL (ref 8.9–10.3)
Chloride: 107 mmol/L (ref 98–111)
Creatinine, Ser: 0.79 mg/dL (ref 0.44–1.00)
GFR, Estimated: >60 mL/min (ref >60)
Glucose, Bld: 78 mg/dL (ref 70–99)
Potassium: 3.7 mmol/L (ref 3.5–5.1)
Sodium: 139 mmol/L (ref 135–145)
Total Bilirubin: 0.2 mg/dL — ABNORMAL LOW (ref 0.3–1.2)
Total Protein: 7.5 g/dL (ref 6.5–8.1)

## 2022-10-20 LAB — CBC
HCT: 38.5 % (ref 36.0–46.0)
Hemoglobin: 12.7 g/dL (ref 12.0–15.0)
MCH: 27.9 pg (ref 26.0–34.0)
MCHC: 33 g/dL (ref 30.0–36.0)
MCV: 84.4 fL (ref 80.0–100.0)
Platelets: 758 10*3/uL — ABNORMAL HIGH (ref 150–400)
RBC: 4.56 MIL/uL (ref 3.87–5.11)
RDW: 15.5 % (ref 11.5–15.5)
WBC: 14.7 10*3/uL — ABNORMAL HIGH (ref 4.0–10.5)
nRBC: 0 % (ref 0.0–0.2)

## 2022-10-20 LAB — LIPASE, BLOOD: Lipase: 23 U/L (ref 11–51)

## 2022-10-20 LAB — HCG, QUANTITATIVE, PREGNANCY: hCG, Beta Chain, Quant, S: <1 m[IU]/mL (ref <5)

## 2022-10-20 MED ORDER — IOHEXOL 300 MG/ML  SOLN
100.0000 mL | Freq: Once | INTRAMUSCULAR | Status: AC | PRN
  Administered 2022-10-20: 100 mL via INTRAVENOUS

## 2022-10-20 MED ORDER — ONDANSETRON HCL 4 MG PO TABS: 4.0000 mg | ORAL_TABLET | Freq: Four times a day (QID) | ORAL | 0 refills | Status: AC

## 2022-10-20 MED ORDER — ONDANSETRON HCL 4 MG/2ML IJ SOLN
4.0000 mg | Freq: Once | INTRAMUSCULAR | Status: AC
  Administered 2022-10-20: 4 mg via INTRAVENOUS
  Filled 2022-10-20: qty 2

## 2022-10-20 MED ORDER — ALUM & MAG HYDROXIDE-SIMETH 200-200-20 MG/5ML PO SUSP
30.0000 mL | Freq: Once | ORAL | Status: AC
  Administered 2022-10-20: 30 mL via ORAL
  Filled 2022-10-20: qty 30

## 2022-10-20 MED ORDER — DICYCLOMINE HCL 10 MG PO CAPS
10.0000 mg | ORAL_CAPSULE | Freq: Once | ORAL | Status: AC
  Administered 2022-10-20: 10 mg via ORAL
  Filled 2022-10-20: qty 1

## 2022-10-20 MED ORDER — DICYCLOMINE HCL 20 MG PO TABS: 20.0000 mg | ORAL_TABLET | Freq: Two times a day (BID) | ORAL | 0 refills | Status: AC

## 2022-10-20 NOTE — ED Provider Notes (Signed)
[place] [facility] AT [facility] Provider Note   CSN: [id number] Arrival date & time: 10/20/22  1905     History  Chief Complaint  Patient presents with   Abdominal Pain    Norma Goodman is a 37 y.o. female.  37 year old female presenting emergency department 4 days of abdominal pain with nausea, no vomiting.  Reports abdominal pain largely in her right upper quadrant.  She gets some nausea after eating, but no vomiting.  Diarrhea with what she thought would like blood.  No fevers, chills.  Reports some generalized malaise and weakness.  She has run out of her Prozac.  Denies prior abdominal surgeries.  No chest pain no shortness of breath.   Abdominal Pain      Home Medications Prior to Admission medications   Medication Sig Start Date End Date Taking? Authorizing Provider  clonazePAM (KLONOPIN) 1 MG tablet TAKE 1 TABLET BY MOUTH EVERY MORNING, 1/2-1 TABLET BY MOUTH AT MIDDAY AS NEEDED AND 1 TABLET EVERY EVENING 07/02/18   Copland, Gwenlyn Found, MD  dicyclomine (BENTYL) 10 MG capsule Take 10 mg by mouth 4 (four) times daily. 10/04/20   [provider]  FLUoxetine (PROZAC) 20 MG capsule Take 3 capsules (60 mg total) by mouth daily. 03/21/18   Copland, Gwenlyn Found, MD  HYDROcodone-acetaminophen [facility]) 7.5-325 MG tablet  01/15/20   [provider]  levonorgestrel (MIRENA) 20 MCG/DAY IUD 1 each by Intrauterine route once. Inserted in 2019.    [provider]  meloxicam (MOBIC) 7.5 MG tablet TAKE 1 TABLET BY MOUTH EVERY DAY AS NEEDED FOR PAIN, DO NOT TAKE WITH OTHER NSAIDS 04/10/18   Copland, Gwenlyn Found, MD  pantoprazole (PROTONIX) 40 MG tablet Take 40 mg by mouth 2 (two) times daily. 08/30/20   [provider]      Allergies    Diazepam    Review of Systems   Review of Systems  Gastrointestinal:  Positive for abdominal pain.    Physical Exam Updated Vital Signs BP 123/77 (BP Location: Right Arm)   Pulse 79   Temp 98.6 F  (37 C) (Oral)   Resp 18   Wt 86.2 kg   SpO2 99%   BMI 34.75 kg/m  Physical Exam Vitals and nursing note reviewed.  Constitutional:      General: She is not in acute distress.    Appearance: She is obese. She is not ill-appearing or toxic-appearing.  HENT:     Head: Normocephalic.  Eyes:     Extraocular Movements: Extraocular movements intact.  Cardiovascular:     Rate and Rhythm: Normal rate and regular rhythm.  Pulmonary:     Effort: Pulmonary effort is normal.     Breath sounds: Normal breath sounds.  Abdominal:     General: Abdomen is flat.     Palpations: Abdomen is soft.     Tenderness: There is abdominal tenderness (mild) in the right upper quadrant and epigastric area. There is no guarding or rebound.  Skin:    General: Skin is warm and dry.  Neurological:     General: No focal deficit present.     Mental Status: She is alert.  Psychiatric:        Mood and Affect: Mood normal.        Behavior: Behavior normal.     ED Results / Procedures / Treatments   Labs (all labs ordered are listed, but only abnormal results are displayed) Labs Reviewed  COMPREHENSIVE METABOLIC PANEL - Abnormal; Notable  for the following components:      Result Value   CO2 19 (*)    AST 11 (*)    Total Bilirubin 0.2 (*)    All other components within normal limits  CBC - Abnormal; Notable for the following components:   WBC 14.7 (*)    Platelets 758 (*)    All other components within normal limits  LIPASE, BLOOD  HCG, QUANTITATIVE, PREGNANCY  URINALYSIS, ROUTINE W REFLEX MICROSCOPIC    EKG None  Radiology CT ABDOMEN PELVIS W CONTRAST  Result Date: 10/20/2022 CLINICAL DATA:  Right upper quadrant pain for several days, initial encounter EXAM: CT ABDOMEN AND PELVIS WITH CONTRAST TECHNIQUE: Multidetector CT imaging of the abdomen and pelvis was performed using the standard protocol following bolus administration of intravenous contrast. RADIATION DOSE REDUCTION: This exam was  performed according to the departmental dose-optimization program which includes automated exposure control, adjustment of the mA and/or kV according to patient size and/or use of iterative reconstruction technique. CONTRAST:  OMNIPAQUE IOHEXOL 300 MG/ML  SOLN COMPARISON:  03/22/18 FINDINGS: Lower chest: No acute abnormality. Hepatobiliary: No focal liver abnormality is seen. No gallstones, gallbladder wall thickening, or biliary dilatation. Pancreas: Unremarkable. No pancreatic ductal dilatation or surrounding inflammatory changes. Spleen: Normal in size without focal abnormality. Adrenals/Urinary Tract: Adrenal glands show a 15 mm hypodense lesion within the right adrenal gland which measures 29 Hounsfield units. A left adrenal lesion measuring up to 18 mm is noted with a Hounsfield unit of 44. these are new from the prior exam. Kidneys are well visualize within normal enhancement pattern. No renal calculi are noted. The bladder is partially distended. Stomach/Bowel: Minimal diverticular change of the colon is noted. No diverticulitis is seen. Mild fluid is noted within the colon which may be related to a diarrheal state. The appendix is within normal limits. Small bowel and stomach are unremarkable. Vascular/Lymphatic: Aortic atherosclerosis. No enlarged abdominal or pelvic lymph nodes. Reproductive: Uterus shows an IUD in place. Right adnexa appears within normal limits. Left adnexa demonstrates a multi cystic appearance new from the prior exam the largest of these measures 3.4 cm and is simple in nature. Other: No abdominal wall hernia or abnormality. No abdominopelvic ascites. Musculoskeletal: No acute or significant osseous findings. IMPRESSION: Bilateral adrenal lesions are noted as described, probable benign adenoma. Recommend follow-up adrenal washout CT in 1 year. If stable for = 1 year, no further follow-up imaging. JACR 2017 Aug; 14(8):1038-44, JCAT 2016 Mar-Apr; 40(2):194-200, Urol J 2006 Spring;  3(2):71-4. Multicystic left adnexa with simple appearing lesions. Ultrasound may be helpful for further evaluation. Diverticulosis without diverticulitis. Electronically Signed   By: Alcide Clever M.D.   On: 10/20/2022 22:35    Procedures Procedures    Medications Ordered in ED Medications  alum & mag hydroxide-simeth (MAALOX/MYLANTA) 200-200-20 MG/5ML suspension 30 mL (30 mLs Oral Given 10/20/22 2056)  dicyclomine (BENTYL) capsule 10 mg (10 mg Oral Given 10/20/22 2056)  ondansetron (ZOFRAN) injection 4 mg (4 mg Intravenous Given 10/20/22 2057)  iohexol (OMNIPAQUE) 300 MG/ML solution 100 mL (100 mLs Intravenous Contrast Given 10/20/22 2202)    ED Course/ Medical Decision Making/ A&P                                 Medical Decision Making Well-appearing 37 year old female presenting emergency department 4 days of abdominal pain.  She is afebrile vital signs reassuring.  Physical exam with soft nonsurgical abdomen, some  mild right upper quadrant type tenderness.  Broad workup largely reassuring.  She has no fever or tachycardia to suggest systemic infection.  Slight leukocytosis.  Her comprehensive panel without significant metabolic derangements.  Normal kidney function.  No transaminitis to suggest hepatobiliary disease.  Her lipase is normal.  Pancreatitis unlikely.  CT scan done with no acute abnormality, did find some incidental adrenal cysts which I do not think are contributing to her symptoms today.  She did complain of what she thought was blood in her diarrhea.  Her hemoglobin is stable at 12.7.  She has had no further episodes of diarrhea or bleeding here in the emergency department.  Treated supportively with Zofran, GI cocktail and Bentyl with some improvement of her symptoms.  She has been able to provide urine, which shared decision making she would like to forego the urinalysis and go home.    Amount and/or Complexity of Data Reviewed Independent Historian:     Details: Friend notes that  she recently ran out of her fluoxetine. External Data Reviewed:     Details: Patient had CT scan done prior in 2020 had some gallstones at that time.  None today.  Also did not appear to have adrenal lesions noted on CT scan today. Labs: ordered. Radiology: ordered.  Risk OTC drugs. Prescription drug management. Decision regarding hospitalization. Risk Details: Labs, vitals and physical exam and CT scans all reassuring.  Patient unlikely to benefit from acute hospitalization.  Discussed supportive care and follow-up PCP.  Patient agreeable plan.          Final Clinical Impression(s) / ED Diagnoses Final diagnoses:  None    Rx / DC Orders ED Discharge Orders     None         Coral Spikes, DO 10/20/22 2354

## 2022-10-20 NOTE — ED Notes (Signed)
Pt stated she attempted x2 to give urine sample and is unable to do so.

## 2022-10-20 NOTE — Discharge Instructions (Signed)
Found on your CT scan: Please follow up with your primary doctor. \  IMPRESSION:  Bilateral adrenal lesions are noted as described,    probable benign adenoma. Recommend follow-up adrenal washout CT in 1  year.

## 2022-10-20 NOTE — ED Triage Notes (Signed)
Patient endorses RUQ pain, vomiting and bright red blood in stools x 4 days. Patient has a history of colitis.

## 2022-11-27 ENCOUNTER — Emergency Department (HOSPITAL_BASED_OUTPATIENT_CLINIC_OR_DEPARTMENT_OTHER): Payer: Medicaid Other

## 2022-11-27 ENCOUNTER — Encounter (HOSPITAL_BASED_OUTPATIENT_CLINIC_OR_DEPARTMENT_OTHER): Payer: Self-pay

## 2022-11-27 ENCOUNTER — Emergency Department (HOSPITAL_BASED_OUTPATIENT_CLINIC_OR_DEPARTMENT_OTHER): Payer: Medicaid Other | Admitting: Radiology

## 2022-11-27 ENCOUNTER — Emergency Department (HOSPITAL_BASED_OUTPATIENT_CLINIC_OR_DEPARTMENT_OTHER)
Admission: EM | Admit: 2022-11-27 | Discharge: 2022-11-27 | Disposition: A | Discharge status: Home / Self Care | Payer: Medicaid Other | Attending: Emergency Medicine | Admitting: Emergency Medicine

## 2022-11-27 ENCOUNTER — Other Ambulatory Visit: Payer: Self-pay

## 2022-11-27 DIAGNOSIS — F17210 Nicotine dependence, cigarettes, uncomplicated: Secondary | ICD-10-CM | POA: Insufficient documentation

## 2022-11-27 DIAGNOSIS — U071 COVID-19: Secondary | ICD-10-CM | POA: Insufficient documentation

## 2022-11-27 DIAGNOSIS — R1013 Epigastric pain: Secondary | ICD-10-CM | POA: Insufficient documentation

## 2022-11-27 DIAGNOSIS — R0602 Shortness of breath: Secondary | ICD-10-CM | POA: Diagnosis present

## 2022-11-27 DIAGNOSIS — J45909 Unspecified asthma, uncomplicated: Secondary | ICD-10-CM | POA: Diagnosis not present

## 2022-11-27 LAB — COMPREHENSIVE METABOLIC PANEL
ALT: 12 U/L (ref 0–44)
AST: 11 U/L — ABNORMAL LOW (ref 15–41)
Albumin: 3.8 g/dL (ref 3.5–5.0)
Alkaline Phosphatase: 68 U/L (ref 38–126)
Anion gap: 7 (ref 5–15)
BUN: 9 mg/dL (ref 6–20)
CO2: 27 mmol/L (ref 22–32)
Calcium: 8.3 mg/dL — ABNORMAL LOW (ref 8.9–10.3)
Chloride: 104 mmol/L (ref 98–111)
Creatinine, Ser: 0.57 mg/dL (ref 0.44–1.00)
GFR, Estimated: >60 mL/min (ref >60)
Glucose, Bld: 75 mg/dL (ref 70–99)
Potassium: 3.9 mmol/L (ref 3.5–5.1)
Sodium: 138 mmol/L (ref 135–145)
Total Bilirubin: 0.1 mg/dL — ABNORMAL LOW (ref 0.3–1.2)
Total Protein: 6.6 g/dL (ref 6.5–8.1)

## 2022-11-27 LAB — CBC WITH DIFFERENTIAL/PLATELET
Abs Immature Granulocytes: 0.02 10*3/uL (ref 0.00–0.07)
Basophils Absolute: 0 10*3/uL (ref 0.0–0.1)
Basophils Relative: 1 %
Eosinophils Absolute: 0.2 10*3/uL (ref 0.0–0.5)
Eosinophils Relative: 3 %
HCT: 34.1 % — ABNORMAL LOW (ref 36.0–46.0)
Hemoglobin: 10.7 g/dL — ABNORMAL LOW (ref 12.0–15.0)
Immature Granulocytes: 0 %
Lymphocytes Relative: 39 %
Lymphs Abs: 2.4 10*3/uL (ref 0.7–4.0)
MCH: 28.1 pg (ref 26.0–34.0)
MCHC: 31.4 g/dL (ref 30.0–36.0)
MCV: 89.5 fL (ref 80.0–100.0)
Monocytes Absolute: 0.7 10*3/uL (ref 0.1–1.0)
Monocytes Relative: 11 %
Neutro Abs: 2.8 10*3/uL (ref 1.7–7.7)
Neutrophils Relative %: 46 %
Platelets: 556 10*3/uL — ABNORMAL HIGH (ref 150–400)
RBC: 3.81 MIL/uL — ABNORMAL LOW (ref 3.87–5.11)
RDW: 18.1 % — ABNORMAL HIGH (ref 11.5–15.5)
WBC: 6 10*3/uL (ref 4.0–10.5)
nRBC: 0 % (ref 0.0–0.2)

## 2022-11-27 LAB — SARS CORONAVIRUS 2 BY RT PCR: SARS Coronavirus 2 by RT PCR: POSITIVE — AB

## 2022-11-27 LAB — LIPASE, BLOOD: Lipase: 31 U/L (ref 11–51)

## 2022-11-27 MED ORDER — KETOROLAC TROMETHAMINE 15 MG/ML IJ SOLN
15.0000 mg | Freq: Once | INTRAMUSCULAR | Status: AC
  Administered 2022-11-27: 15 mg via INTRAVENOUS
  Filled 2022-11-27: qty 1

## 2022-11-27 MED ORDER — PANTOPRAZOLE SODIUM 20 MG PO TBEC: 20.0000 mg | DELAYED_RELEASE_TABLET | Freq: Two times a day (BID) | ORAL | 2 refills | Status: AC

## 2022-11-27 MED ORDER — DICYCLOMINE HCL 10 MG/ML IM SOLN
20.0000 mg | Freq: Once | INTRAMUSCULAR | Status: AC
  Administered 2022-11-27: 20 mg via INTRAMUSCULAR
  Filled 2022-11-27: qty 2

## 2022-11-27 MED ORDER — ALUM & MAG HYDROXIDE-SIMETH 200-200-20 MG/5ML PO SUSP
15.0000 mL | Freq: Once | ORAL | Status: AC
  Administered 2022-11-27: 15 mL via ORAL
  Filled 2022-11-27: qty 30

## 2022-11-27 MED ORDER — OXYCODONE-ACETAMINOPHEN 5-325 MG PO TABS
2.0000 | ORAL_TABLET | Freq: Once | ORAL | Status: AC
  Administered 2022-11-27: 2 via ORAL
  Filled 2022-11-27: qty 2

## 2022-11-27 MED ORDER — IOHEXOL 300 MG/ML  SOLN
100.0000 mL | Freq: Once | INTRAMUSCULAR | Status: AC | PRN
  Administered 2022-11-27: 80 mL via INTRAVENOUS

## 2022-11-27 MED ORDER — FAMOTIDINE 20 MG PO TABS
20.0000 mg | ORAL_TABLET | Freq: Once | ORAL | Status: AC
  Administered 2022-11-27: 20 mg via ORAL
  Filled 2022-11-27: qty 1

## 2022-11-27 MED ORDER — SODIUM CHLORIDE 0.9 % IV BOLUS
1000.0000 mL | Freq: Once | INTRAVENOUS | Status: AC
  Administered 2022-11-27: 1000 mL via INTRAVENOUS

## 2022-11-27 MED ORDER — MORPHINE SULFATE (PF) 4 MG/ML IV SOLN
4.0000 mg | Freq: Once | INTRAVENOUS | Status: AC
  Administered 2022-11-27: 4 mg via INTRAVENOUS
  Filled 2022-11-27: qty 1

## 2022-11-27 NOTE — ED Provider Notes (Signed)
[place] EMERGENCY DEPARTMENT AT [facility] Provider Note   CSN: [id number] Arrival date & time: 11/27/22  1305     History  Chief Complaint  Patient presents with   Abdominal Pain   Shortness of Breath    Norma Goodman is a 37 y.o. female.   Abdominal Pain Associated symptoms: shortness of breath   Shortness of Breath Associated symptoms: abdominal pain     37 year old female presents emergency department with complaints of abdominal pain, diarrhea.  Patient reports abdominal pain began 3 days or so.  Reports constant pain since symptom onset.  States she tried Motrin, Maalox without significant improvement of symptoms.  Reports upper middle abdominal pain with some radiation to her back.  States that she was seen early August for the exact same symptoms with negative CT at that time.  States that she was seen by her primary care for the same symptoms and prescribed Bentyl which she states has not been helping very much.  Denies any fever, nausea, vomiting, urinary symptoms, vaginal symptoms.  Does state over the past 3 days has had some loose bowel movements.  Denies any chills, night sweats, nasal congestion, sore throat.  Past medical history significant for GERD, depression, asthma, anxiety, anemia  Home Medications Prior to Admission medications   Medication Sig Start Date End Date Taking? Authorizing Provider  pantoprazole (PROTONIX) 20 MG tablet Take 1 tablet (20 mg total) by mouth 2 (two) times daily. 11/27/22  Yes Sherian Maroon A, PA  clonazePAM (KLONOPIN) 1 MG tablet TAKE 1 TABLET BY MOUTH EVERY MORNING, 1/2-1 TABLET BY MOUTH AT MIDDAY AS NEEDED AND 1 TABLET EVERY EVENING 07/02/18   Copland, Gwenlyn Found, MD  dicyclomine (BENTYL) 20 MG tablet Take 1 tablet (20 mg total) by mouth 2 (two) times daily for 3 days. 10/20/22 10/23/22  Coral Spikes, DO  FLUoxetine (PROZAC) 20 MG capsule Take 3 capsules (60 mg total) by mouth daily. 03/21/18   Copland, Gwenlyn Found, MD   HYDROcodone-acetaminophen [facility]) 7.5-325 MG tablet  01/15/20   [provider]  levonorgestrel (MIRENA) 20 MCG/DAY IUD 1 each by Intrauterine route once. Inserted in 2019.    [provider]  meloxicam (MOBIC) 7.5 MG tablet TAKE 1 TABLET BY MOUTH EVERY DAY AS NEEDED FOR PAIN, DO NOT TAKE WITH OTHER NSAIDS 04/10/18   Copland, Gwenlyn Found, MD  pantoprazole (PROTONIX) 40 MG tablet Take 40 mg by mouth 2 (two) times daily. 08/30/20   [provider]      Allergies    Diazepam    Review of Systems   Review of Systems  Respiratory:  Positive for shortness of breath.   Gastrointestinal:  Positive for abdominal pain.  All other systems reviewed and are negative.   Physical Exam Updated Vital Signs BP 117/88   Pulse 79   Temp 98.1 F (36.7 C)   Resp 18   LMP 11/23/2022 Comment: pt stated she had IUD and period started 11/23/22  SpO2 97%  Physical Exam Vitals and nursing note reviewed.  Constitutional:      General: She is not in acute distress.    Appearance: She is well-developed.  HENT:     Head: Normocephalic and atraumatic.     Nose: Congestion and rhinorrhea present.     Comments: Patient actively sniffling in triage.    Mouth/Throat:     Mouth: Mucous membranes are moist.     Pharynx: Oropharynx is clear.  Eyes:     Conjunctiva/sclera: Conjunctivae normal.  Cardiovascular:     Rate and Rhythm: Normal rate and regular rhythm.     Heart sounds: No murmur heard. Pulmonary:     Effort: Pulmonary effort is normal. No respiratory distress.     Breath sounds: Normal breath sounds. No wheezing, rhonchi or rales.  Abdominal:     Palpations: Abdomen is soft.     Tenderness: There is abdominal tenderness in the epigastric area. There is no right CVA tenderness, left CVA tenderness, guarding or rebound. Negative signs include Murphy's sign and McBurney's sign.  Musculoskeletal:        General: No swelling.     Cervical back: Neck supple.  Skin:    General:  Skin is warm and dry.     Capillary Refill: Capillary refill takes less than 2 seconds.  Neurological:     Mental Status: She is alert.  Psychiatric:        Mood and Affect: Mood normal.     ED Results / Procedures / Treatments   Labs (all labs ordered are listed, but only abnormal results are displayed) Labs Reviewed  SARS CORONAVIRUS 2 BY RT PCR - Abnormal; Notable for the following components:      Result Value   SARS Coronavirus 2 by RT PCR POSITIVE (*)    All other components within normal limits  COMPREHENSIVE METABOLIC PANEL - Abnormal; Notable for the following components:   Calcium 8.3 (*)    AST 11 (*)    Total Bilirubin 0.1 (*)    All other components within normal limits  CBC WITH DIFFERENTIAL/PLATELET - Abnormal; Notable for the following components:   RBC 3.81 (*)    Hemoglobin 10.7 (*)    HCT 34.1 (*)    RDW 18.1 (*)    Platelets 556 (*)    All other components within normal limits  LIPASE, BLOOD    EKG None  Radiology CT ABDOMEN PELVIS W CONTRAST  Result Date: 11/27/2022 CLINICAL DATA:  Acute abdominal pain EXAM: CT ABDOMEN AND PELVIS WITH CONTRAST TECHNIQUE: Multidetector CT imaging of the abdomen and pelvis was performed using the standard protocol following bolus administration of intravenous contrast. RADIATION DOSE REDUCTION: This exam was performed according to the departmental dose-optimization program which includes automated exposure control, adjustment of the mA and/or kV according to patient size and/or use of iterative reconstruction technique. CONTRAST:  80mL OMNIPAQUE IOHEXOL 300 MG/ML  SOLN COMPARISON:  10/20/2022 FINDINGS: Lower chest: No acute abnormality. Hepatobiliary: No focal liver abnormality is seen. No gallstones, gallbladder wall thickening, or biliary dilatation. Pancreas: Unremarkable. No pancreatic ductal dilatation or surrounding inflammatory changes. Spleen: Normal in size without focal abnormality. Adrenals/Urinary Tract: Stable  hypodense lesion is noted within the right adrenal gland similar to that seen on the prior exam. Small left adrenal nodule is noted stable from the prior exam. Kidneys demonstrate a normal enhancement pattern. No renal calculi or obstructive changes are seen. The bladder is well distended. Stomach/Bowel: The appendix is within normal limits. No obstructive or inflammatory changes of the colon are noted. Stomach and small bowel are unremarkable. Vascular/Lymphatic: Aortic atherosclerosis. No enlarged abdominal or pelvic lymph nodes. Reproductive: IUD is noted in place. Cystic changes in the ovaries are noted stable from the prior exam. Other: No abdominal wall hernia or abnormality. No abdominopelvic ascites. Musculoskeletal: No acute or significant osseous findings. IMPRESSION: Stable adrenal lesions likely representing adenomas. Again adrenal washout CT in August of 2025 is recommended. Cystic change in the ovaries bilaterally although no discrete sizable cyst is  seen. No further follow-up is recommended. No other focal abnormality is seen. Electronically Signed   By: Alcide Clever M.D.   On: 11/27/2022 19:19   DG Chest 2 View  Result Date: 11/27/2022 CLINICAL DATA:  Severe abdominal pain.  Shortness of breath. EXAM: CHEST - 2 VIEW COMPARISON:  05/02/2017; chest CT-02/16/2017 FINDINGS: Normal cardiac silhouette and mediastinal contours. There is mild elevation/eventration of the right hemidiaphragm. There is mild diffuse nodular thickening of the pulmonary interstitium, similar to the 2019 examination. No discrete focal airspace opacities. No pleural effusion or pneumothorax. No evidence of edema. No acute osseous abnormalities. IMPRESSION: Corona bronchitic change without superimposed acute cardiopulmonary disease. Electronically Signed   By: Simonne Come M.D.   On: 11/27/2022 14:46    Procedures Procedures    Medications Ordered in ED Medications  sodium chloride 0.9 % bolus 1,000 mL (0 mLs Intravenous  Stopped 11/27/22 1629)  ketorolac (TORADOL) 15 MG/ML injection 15 mg (15 mg Intravenous Given 11/27/22 1521)  morphine (PF) 4 MG/ML injection 4 mg (4 mg Intravenous Given 11/27/22 1552)  iohexol (OMNIPAQUE) 300 MG/ML solution 100 mL (80 mLs Intravenous Contrast Given 11/27/22 1727)  dicyclomine (BENTYL) injection 20 mg (20 mg Intramuscular Given 11/27/22 1804)  oxyCODONE-acetaminophen (PERCOCET/ROXICET) 5-325 MG per tablet 2 tablet (2 tablets Oral Given 11/27/22 1934)  alum & mag hydroxide-simeth (MAALOX/MYLANTA) 200-200-20 MG/5ML suspension 15 mL (15 mLs Oral Given 11/27/22 1933)  famotidine (PEPCID) tablet 20 mg (20 mg Oral Given 11/27/22 1934)    ED Course/ Medical Decision Making/ A&P Clinical Course as of 11/27/22 2257  Mon Nov 27, 2022  1551 Patient stable on her side complaining of severe abdominal pain.  Will [CR]    Clinical Course User Index [CR] Peter Garter, PA                                 Medical Decision Making Amount and/or Complexity of Data Reviewed Labs: ordered. Radiology: ordered.  Risk OTC drugs. Prescription drug management.   This patient presents to the ED for concern of abdominal pain, this involves an extensive number of treatment options, and is a complaint that carries with it a high risk of complications and morbidity.  The differential diagnosis includes gastritis, PUD, esophagitis, pancreatitis, CBD pathology, hepatitis, SBO/LBO, volvulus, diverticulitis, appendicitis, pyelonephritis, nephrolithiasis, cystitis   Co morbidities that complicate the patient evaluation  See HPI   Additional history obtained:  Additional history obtained from EMR External records from outside source obtained and reviewed including hospital records   Lab Tests:  I Ordered, and personally interpreted labs.  The pertinent results include: COVID positive.  No leukocytosis.  Patient with evidence of anemia with a hemoglobin of 10.7 of which is normocytic in nature  and near patient's baseline.  Thrombocytosis of 556.  Mild hypocalcemia of 8.3 but otherwise, electrolytes within normal limits.  No transaminitis.  No renal dysfunction.  Lipase within normal limits.   Imaging Studies ordered:  I ordered imaging studies including chest x-ray, CT abdomen pelvis I independently visualized and interpreted imaging which showed  Chest x-ray: Corona bronchitic change without superimposed acute cardiopulmonary abnormality. CT abdomen pelvis: Stable adrenal lesions.  Cystic changes of bilateral ovaries.  No obvious other abnormality. I agree with the radiologist interpretation  Cardiac Monitoring: / EKG:  The patient was maintained on a cardiac monitor.  I personally viewed and interpreted the cardiac monitored which showed an underlying rhythm of: Sinus  rhythm   Consultations Obtained:  N/a   Problem List / ED Course / Critical interventions / Medication management  Abdominal pain, COVID, diarrhea I ordered medication including 1 L normal saline, Toradol, morphine   Reevaluation of the patient after these medicines showed that the patient improved I have reviewed the patients home medicines and have made adjustments as needed   Social Determinants of Health:  Chronic cigarette use.  Denies illicit drug use.   Test / Admission - Considered:  Abdominal pain, COVID, diarrhea Vitals signs significant for hypertension blood pressure 143/93. Otherwise within normal range and stable throughout visit. Laboratory/imaging studies significant for: See above 37 year old female presents emergency department with complaints of abdominal pain with some radiation to her back as well as loose bowel movements for 3 days.  Laboratory studies significant for COVID-positive but otherwise unremarkable for any acute process.  CT imaging ranges abdomen was obtained given persistent abdominal pain despite multiple rounds of medications to treat patient's abdominal pain.  CT  imaging was obtained given patient's continued abdominal pain which was negative for any acute process.  Patient did note some improvement of symptoms with administration of GI cocktail.  Will recommend continue use of PPI in the outpatient setting as well as GERD friendly diet and follow-up with GI in the outpatient setting.  Treatment plan discussed at length with patient and she acknowledged understanding was agreeable to said plan.  Patient overall well-appearing, afebrile in no acute distress. Worrisome signs and symptoms were discussed with the patient, and the patient acknowledged understanding to return to the ED if noticed. Patient was stable upon discharge.          Final Clinical Impression(s) / ED Diagnoses Final diagnoses:  Epigastric abdominal pain  COVID    Rx / DC Orders ED Discharge Orders          Ordered    pantoprazole (PROTONIX) 20 MG tablet  2 times daily        11/27/22 1943              Peter Garter, [place] 11/27/22 2257    Melene Plan, DO 11/27/22 2258

## 2022-11-27 NOTE — ED Notes (Signed)
Pt unable to provide urine sample at this time; is aware of the need for one

## 2022-11-27 NOTE — ED Triage Notes (Signed)
Pt c/o "really severe abd pain," SHOB, back hurts, diarrhea x3 days. Sniffling in triage, but denies congestion/ sinus issues.

## 2022-11-27 NOTE — Discharge Instructions (Addendum)
As discussed, workup today overall reassuring.  CT imaging did not show anything abnormal causing your symptoms.  Will treat for reflux in the outpatient setting.  Will recommend beginning PPI as well as lifestyle modifications as described in your discharge papers.  You may continue Bentyl as well.  Will attach information for GI doctor given your abdominal discomfort to follow-up with.  Please not hesitate to return to emergency department for worrisome signs and symptoms we discussed become apparent.

## 2022-12-04 NOTE — Progress Notes (Signed)
 [facility] [place]  FOLLOW-UP PATIENT HEMATOLOGY NOTE  Date:12/04/2022  DIAGNOSIS: Iron deficiency anemia due to menorrhagia and GI bleed secondary to gastric ulcer and internal hemorrhoids  TREATMENT:  - Intolerant to oral iron due to gastric ulcer - Feraheme  x 2 09/20/2021, 10/10/2021   TODAY'S VISIT: Norma Goodman is a 37 year old woman presenting with iron deficiency anemia.  She was last seen by Dr. Maurice in summer 2023.  She was found to have iron deficiency at that time and was treated with Feraheme  x 2 completed in July 2023.  She reports that she had been doing well up until 2 months ago when she started having..  She has an IUD in place and has not had a period for 2 years up until 2 months ago. Her periods last for 3 to 5 days and she needs to change pads every 3-4 hours.  She has also has recurrence bleeding per rectum, describes bright red blood mixed with the stool and on toilet paper after wiping ongoing intermittently but progressively worsening over the last couple of months.  She had a colonoscopy and an EGD in 2022.  Colonoscopy was normal, EGD showed an ulceration in the antrum.  She has been on PPI since then.  She also describes recurrent epigastric pain postprandially.  She went to the ED with last Monday for abdominal pain, she was diagnosed with COVID and CT abdomen pelvis was negative for acute process.  She had a prior ED visit in August with the same complaint.  CBC from that visit showed hemoglobin of 10.7 platelets 556k.  PAST MEDICAL HISTORY: Past Medical History:  Diagnosis Date  . Anxiety   . Arthritis   . Depression   . Hyperlipidemia   . Neck mass     PAST SURGICAL HISTORY: Past Surgical History:  Procedure Laterality Date  . Cardiac catheterization     [facility] hospital  . Cardiac surgery  2019   Pt denies states had cardiac cath  . Coronary artery bypass graft    . Upper gastrointestinal endoscopy  unknown      MEDICATIONS:  Current Outpatient Medications:  .  albuterol  sulfate HFA (PROVENTIL ,VENTOLIN ,PROAIR ) 108 (90 Base) MCG/ACT inhaler, Inhale one puff into the lungs every 4 (four) hours as needed., Disp: 8 g, Rfl: 5 .  ALPRAZolam (XANAX) 0.5 mg tablet, Take one tablet (0.5 mg dose) by mouth 2 (two) times daily., Disp: , Rfl:  .  dicyclomine  (BENTYL ) 10 mg capsule, TAKE 1 CAPSULE BY MOUTH FOUR TIMES DAILY AS NEEDED, Disp: 120 capsule, Rfl: 0 .  gabapentin (NEURONTIN) 600 mg tablet, Take one tablet (600 mg dose) by mouth 3 (three) times a day., Disp: , Rfl:  .  loperamide (IMODIUM) 2 MG capsule, Take one capsule (2 mg dose) by mouth 4 (four) times a day as needed., Disp: , Rfl:  .  oxyCODONE -acetaminophen  (PERCOCET,ENDOCET) 10-325 mg per tablet, Take one tablet by mouth 4 (four) times a day as needed., Disp: , Rfl:  .  pantoprazole  sodium (PROTONIX ) 40 mg tablet, Take one tablet (40 mg dose) by mouth daily., Disp: 60 tablet, Rfl: 5 .  propranolol HCl (INDERAL LA) 60 mg 24 hr capsule, Take one capsule (60 mg dose) by mouth daily., Disp: 90 capsule, Rfl: 0 .  rosuvastatin calcium  (CRESTOR) 20 mg tablet, Take one tablet (20 mg dose) by mouth daily., Disp: 90 tablet, Rfl: 1 .  venlafaxine HCl (EFFEXOR-XR) 150 mg 24 hr capsule, Take one capsule (150  mg dose) by mouth every morning for 30 days., Disp: 30 capsule, Rfl: 0 No current facility-administered medications for this visit.  Facility-Administered Medications Ordered in Other Visits:  .  iopamidol  (ISOVUE -370) 76% injection 100 mL, 100 mL, IntraVENous, Once in Imaging, Onetha JINNY Larger, MD     ALLERGIES: No Known Allergies  FAMILY HISTORY: Family History  Problem Relation Age of Onset  . No Known Problems Mother   . Heart disease Father   . Diabetes Father   . Depression Father   . Cancer Father        bladder  . No Known Problems Sister   . No Known Problems Brother   . No Known Problems Brother     SOCIAL HISTORY: Social History    Socioeconomic History  . Marital status: Single  . Number of children: 1  Tobacco Use  . Smoking status: Every Day    Packs/day: 0.50    Years: 15.00    Additional pack years: 0.00    Total pack years: 7.50    Types: Cigarettes    Start date: 03/13/2002  . Smokeless tobacco: Never  Substance and Sexual Activity  . Alcohol use: Never  . Drug use: Not Currently  . Sexual activity: Yes    Partners: Male    Birth control/protection: I.U.D.    REVIEW OF SYSTEMS: A full review of systems was obtained and was negative except as above.     PHYSICAL EXAMINATION: VITAL SIGNS: Vitals:   12/04/22 1121  BP: 132/88  BP Location: Left arm  Patient Position: Sitting  Pulse: 86  Resp: 16  Temp: 97.1 F (36.2 C)  TempSrc: Temporal  SpO2: 96%  Weight: 196 lb (88.9 kg)  Height: 5' 2 (1.575 m)    Wt Readings from Last 3 Encounters:  12/04/22 196 lb (88.9 kg)  10/11/21 207 lb 6.4 oz (94.1 kg)  10/07/21 205 lb (93 kg)    ECOG performance status: 1 - Symptomatic but completely ambulatory   GENERAL: No apparent distress. AAOx3 SKIN: No lesions, or rash. RESPIRATORY: Clear to auscultation bilaterally. Normal work of breathing. CARDIOVASCULAR: RRR, no m/r/g. No peripheral edema noted. ABDOMEN: Symmetric. Nontender to palpation. No hepatosplenomegaly noted EXTREMITIES: No cyanosis, clubbing or edema. Normal ROM. LYMPH: No cervical, axillary, or supraclavicular adenopathy NEUROLOGIC: Nonfocal. Alert and oriented x 3. Normal sensation and strength. MUSCULOSKELETAL: Normal ROM noted. Strength and tone are maintained and symmetrical.  LABS/radiology Reviewed recent CBC and CT abdomen pelvis from ER visit from 9/16/124  CT abdomen pelvis Result Date: 11/27/2022 CLINICAL DATA: Acute abdominal pain EXAM: CT ABDOMEN AND PELVIS WITH CONTRAST TECHNIQUE: Multidetector CT imaging of the abdomen and pelvis was performed using the standard protocol following bolus administration of  intravenous contrast. RADIATION DOSE REDUCTION: This exam was performed according to the departmental dose-optimization program which includes automated exposure control, adjustment of the mA and/or kV according to patient size and/or use of iterative reconstruction technique. CONTRAST: 80mL OMNIPAQUE  IOHEXOL  300 MG/ML SOLN COMPARISON: 10/20/2022 FINDINGS: Lower chest: No acute abnormality. Hepatobiliary: No focal liver abnormality is seen. No gallstones, gallbladder wall thickening, or biliary dilatation. Pancreas: Unremarkable. No pancreatic ductal dilatation or surrounding inflammatory changes. Spleen: Normal in size without focal abnormality. Adrenals/Urinary Tract: Stable hypodense lesion is noted within the right adrenal gland similar to that seen on the prior exam. Small left adrenal nodule is noted stable from the prior exam. Kidneys demonstrate a normal enhancement pattern. No renal calculi or obstructive changes are seen. The bladder is well  distended. Stomach/Bowel: The appendix is within normal limits. No obstructive or inflammatory changes of the colon are noted. Stomach and small bowel are unremarkable. Vascular/Lymphatic: Aortic atherosclerosis. No enlarged abdominal or pelvic lymph nodes. Reproductive: IUD is noted in place. Cystic changes in the ovaries are noted stable from the prior exam. Other: No abdominal wall hernia or abnormality. No abdominopelvic ascites. Musculoskeletal: No acute or significant osseous findings. IMPRESSION: Stable adrenal lesions likely representing adenomas. Again adrenal washout CT in August of 2025 is recommended. Cystic change in the ovaries bilaterally although no discrete sizable cyst is seen. No further follow-up is recommended. No other focal abnormality is seen. Electronically Signed By: Oneil Devonshire M.D. On: 11/27/2022 19:19  ASSESSMENT AND PLAN: Norma Goodman is a 37 y.o. female who presents for follow-up on iron deficiency anemia.  #  Iron Deficiency  Anemia Secondary to menorrhagia and GI bleed from underlying gastric antrum ulcer and internal hemorrhoids.  Received Feraheme  last July 2023.  She is intolerant to oral iron due to her epigastric pain from her ulcer.  Hemoglobin from 11/27/2022 was down to 10 and platelets in the 500s concerning for recurrent iron deficiency. -Will repeat CBC and iron studies today along with B12 and folic acid; if iron deficient will proceed with Feraheme  x 2 weekly -Return to clinic in 3 months for MD visit and labs -I will refer back to GI Dr. Nonnie, suspect she will need a repeat EGD  # Thrombocytosis Likely reactive in the setting of iron deficiency though will monitor for improvement following IV iron.  She does note that she has a sister with essential thrombocytosis.  I do not think is playing at this time but will continue to monitor for now.  # Stable adrenal lesions likely representing adenomas.  Seen on C from 11/27/2022.  Adrenal washout CT in August of 2025 is recommended.  RTCIn 3 months for visit and labs.

## 2022-12-27 ENCOUNTER — Telehealth: Payer: Self-pay | Admitting: Nurse Practitioner

## 2022-12-27 NOTE — Telephone Encounter (Signed)
Scheduled appointments per referral. Patient is aware of the appointment time and date as well as the address. Patient was informed to arrive 10-15 minutes prior with updated insurance information. All questions were answered.

## 2023-01-22 ENCOUNTER — Inpatient Hospital Stay: Payer: Medicaid Other

## 2023-01-22 ENCOUNTER — Inpatient Hospital Stay: Payer: Medicaid Other | Attending: Nurse Practitioner | Admitting: Nurse Practitioner

## 2023-01-22 NOTE — Progress Notes (Deleted)
[facility]   [phone] [phone]   Clinic New consult Note   Patient Care Team: Bettey Costa, NP as PCP - General (Nurse Practitioner) 01/22/2023  CHIEF COMPLAINTS/PURPOSE OF CONSULTATION:  ***Anemia  ASSESSMENT & PLAN:  Norma Goodman is a 37 y.o. *** female with a history of ***  1. *** Anemia ***   PLAN: ***   HISTORY OF PRESENTING ILLNESS:  Norma Goodman 37 y.o. female is here because of *** anemia.  ***She was found to have abnormal CBC from *** ***She denies recent chest pain on exertion, shortness of breath on minimal exertion, pre-syncopal episodes, or palpitations. ***She had not noticed any recent bleeding such as epistaxis, hematuria or hematochezia ***The patient denies over the counter NSAID ingestion. She is not *** on antiplatelets agents. Her last colonoscopy was *** ***She had no prior history or diagnosis of cancer. Her age appropriate screening programs are up-to-date. ***She denies any pica and eats a variety of diet. ***She never donated blood or received blood transfusion ***The patient was prescribed oral iron supplements and she takes ***   REVIEW OF SYSTEMS:   Constitutional: Denies fevers, chills or abnormal night sweats Eyes: Denies blurriness of vision, double vision or watery eyes Ears, nose, mouth, throat, and face: Denies mucositis or sore throat Respiratory: Denies cough, dyspnea or wheezes Cardiovascular: Denies palpitation, chest discomfort or lower extremity swelling Gastrointestinal:  Denies nausea, heartburn or change in bowel habits Skin: Denies abnormal skin rashes Lymphatics: Denies new lymphadenopathy or easy bruising Neurological:Denies numbness, tingling or new weaknesses Behavioral/Psych: Mood is stable, no new changes   All other systems were reviewed with the patient and are negative.   MEDICAL HISTORY:  Past Medical History:  Diagnosis Date   Anemia    Anxiety      Klonopin as needed   Asthma    inhaler   Depression    Antidepressent (generic Prozac)   Gallstones    Hx of gastroesophageal reflux (GERD)    Takes Prilosec daily    SURGICAL HISTORY: Past Surgical History:  Procedure Laterality Date   ESOPHAGOGASTRODUODENOSCOPY  2016   LEFT HEART CATH AND CORONARY ANGIOGRAPHY N/A 05/08/2017   Procedure: LEFT HEART CATH AND CORONARY ANGIOGRAPHY;  Surgeon: [place], Peter M, MD;  Location: [facility] INVASIVE CV LAB;  Service: Cardiovascular;  Laterality: N/A;    SOCIAL HISTORY: Social History   Socioeconomic History   Marital status: Single    Spouse name: Not on file   Number of children: 1   Years of education: Not on file   Highest education level: Not on file  Occupational History   Not on file  Tobacco Use   Smoking status: Every Day    Current packs/day: 0.50    Average packs/day: 0.5 packs/day for 18.0 years (9.0 ttl pk-yrs)    Types: Cigarettes    Start date: 03/13/2006   Smokeless tobacco: Never  Vaping Use   Vaping status: Never Used  Substance and Sexual Activity   Alcohol use: No    Alcohol/week: 0.0 standard drinks of alcohol   Drug use: Never   Sexual activity: Not Currently    Partners: Male    Birth control/protection: I.U.D.    Comment: pt has a fiance'  Other Topics Concern   Not on file  Social History Narrative   Not on file   Social Determinants of Health   Financial Resource Strain: Low Risk  (12/04/2022)   Received from [facility]   Overall Financial  Resource Strain (CARDIA)    Difficulty of Paying Living Expenses: Not hard at all  Food Insecurity: No Food Insecurity (12/04/2022)   Received from [facility]   Hunger Vital Sign    Worried About Running Out of Food in the Last Year: Never true    Ran Out of Food in the Last Year: Never true  Transportation Needs: No Transportation Needs (12/04/2022)   Received from [facility] - Transportation    Lack of Transportation (Medical): No    Lack of  Transportation (Non-Medical): No  Physical Activity: Unknown (04/04/2022)   [facility]   Exercise Vital Sign    Days of Exercise per Week: Patient declined    Minutes of Exercise per Session: Not on file  Stress: Stress Concern Present (04/04/2022)   Received from [facility] - Occupational Stress Questionnaire    Feeling of Stress : Very much  Social Connections: Unknown (05/27/2022)   Received from [facility]   Social Network    Social Network: Not on file  Intimate Partner Violence: Unknown (05/27/2022)   Received from [facility]   HITS    Physically Hurt: Not on file    Insult or Talk Down To: Not on file    Threaten Physical Harm: Not on file    Scream or Curse: Not on file    FAMILY HISTORY: Family History  Problem Relation Age of Onset   Diabetes Father    Hypertension Father    Heart disease Father     ALLERGIES:  is allergic to diazepam.  MEDICATIONS:  Current Outpatient Medications  Medication Sig Dispense Refill   clonazePAM (KLONOPIN) 1 MG tablet TAKE 1 TABLET BY MOUTH EVERY MORNING, 1/2-1 TABLET BY MOUTH AT MIDDAY AS NEEDED AND 1 TABLET EVERY EVENING 90 tablet 0   dicyclomine (BENTYL) 20 MG tablet Take 1 tablet (20 mg total) by mouth 2 (two) times daily for 3 days. 6 tablet 0   FLUoxetine (PROZAC) 20 MG capsule Take 3 capsules (60 mg total) by mouth daily. 270 capsule 3   HYDROcodone-acetaminophen (NORCO) 7.5-325 MG tablet      levonorgestrel (MIRENA) 20 MCG/DAY IUD 1 each by Intrauterine route once. Inserted in 2019.     meloxicam (MOBIC) 7.5 MG tablet TAKE 1 TABLET BY MOUTH EVERY DAY AS NEEDED FOR PAIN, DO NOT TAKE WITH OTHER NSAIDS 30 tablet 2   pantoprazole (PROTONIX) 20 MG tablet Take 1 tablet (20 mg total) by mouth 2 (two) times daily. 60 tablet 2   pantoprazole (PROTONIX) 40 MG tablet Take 40 mg by mouth 2 (two) times daily.     No current facility-administered medications for this visit.     PHYSICAL EXAMINATION: ECOG PERFORMANCE STATUS: {CHL ONC ECOG [phone]}  There were no vitals filed for this visit. There were no vitals filed for this visit.  GENERAL:alert, no distress and comfortable SKIN: skin color, texture, turgor are normal, no rashes or significant lesions EYES: normal, conjunctiva are pink and non-injected, sclera clear OROPHARYNX:no exudate, no erythema and lips, buccal mucosa, and tongue normal  NECK: supple, thyroid normal size, non-tender, without nodularity LYMPH:  no palpable lymphadenopathy in the cervical, axillary or inguinal LUNGS: clear to auscultation and percussion with normal breathing effort HEART: regular rate & rhythm and no murmurs and no lower extremity edema ABDOMEN:abdomen soft, non-tender and normal bowel sounds Musculoskeletal:no cyanosis of digits and no clubbing  PSYCH: alert & oriented x 3 with  fluent speech NEURO: no focal motor/sensory deficits  LABORATORY DATA:  I have reviewed the data as listed    Latest Ref Rng & Units 11/27/2022    3:20 PM 10/20/2022    7:18 PM 04/02/2018    1:36 PM  CBC  WBC 4.0 - 10.5 K/uL 6.0  14.7  10.7   Hemoglobin 12.0 - 15.0 g/dL 40.9  81.1  91.4   Hematocrit 36.0 - 46.0 % 34.1  38.5  41.2   Platelets 150 - 400 K/uL 556  758  467        Latest Ref Rng & Units 11/27/2022    3:20 PM 10/20/2022    7:18 PM 03/21/2018    1:38 PM  CMP  Glucose 70 - 99 mg/dL 75  78  95   BUN 6 - 20 mg/dL 9  14  21    Creatinine 0.44 - 1.00 mg/dL 7.82  9.56  2.13   Sodium 135 - 145 mmol/L 138  139  140   Potassium 3.5 - 5.1 mmol/L 3.9  3.7  4.4   Chloride 98 - 111 mmol/L 104  107  108   CO2 22 - 32 mmol/L 27  19  25    Calcium 8.9 - 10.3 mg/dL 8.3  9.4  9.4   Total Protein 6.5 - 8.1 g/dL 6.6  7.5  6.4   Total Bilirubin 0.3 - 1.2 mg/dL 0.1  0.2  0.1   Alkaline Phos 38 - 126 U/L 68  71  60   AST 15 - 41 U/L 11  11  11    ALT 0 - 44 U/L 12  12  15       RADIOGRAPHIC STUDIES: I have personally reviewed the  radiological images as listed and agreed with the findings in the report. No results found.  No orders of the defined types were placed in this encounter.   All questions were answered. The patient knows to call the clinic with any problems, questions or concerns. The total time spent in the appointment was {CHL ONC TIME VISIT - [id number]}.     Carlean Jews, NP 01/22/2023 12:48 PM  I, Mickie Bail, am acting as scribe for Malachy Mood, MD.   {Add scribe attestation statement}

## 2023-02-12 NOTE — Progress Notes (Signed)
 Adult Non-Chemo Infusion Note Feraheme     Infusion Assessment  Pt here to receive Feraheme .   Blood return was able to be verified from peripheral IV.    Refer to [facility] for infusion administration times.  Patient tolerated infusion without complications.  Blood return verified after infusion completed.   IV Flushed  IV line flushed per protocol after infusion completed.   Post-Infusion Instructions  Patient was instructed to notify medical team if they experience any infusion site tenderness.
